# Patient Record
Sex: Female | Born: 1955 | Race: White | Hispanic: No | Smoking: Former smoker
Health system: Southern US, Community
[De-identification: ages and names within clinical notes are randomized; demographics above are authoritative.]

## PROBLEM LIST (undated history)

## (undated) DIAGNOSIS — K5790 Diverticulosis of intestine, part unspecified, without perforation or abscess without bleeding: Secondary | ICD-10-CM

## (undated) DIAGNOSIS — I10 Essential (primary) hypertension: Secondary | ICD-10-CM

## (undated) DIAGNOSIS — F32A Depression, unspecified: Secondary | ICD-10-CM

## (undated) DIAGNOSIS — K635 Polyp of colon: Secondary | ICD-10-CM

## (undated) DIAGNOSIS — F329 Major depressive disorder, single episode, unspecified: Secondary | ICD-10-CM

## (undated) DIAGNOSIS — D649 Anemia, unspecified: Secondary | ICD-10-CM

## (undated) DIAGNOSIS — F419 Anxiety disorder, unspecified: Secondary | ICD-10-CM

## (undated) DIAGNOSIS — E785 Hyperlipidemia, unspecified: Secondary | ICD-10-CM

## (undated) DIAGNOSIS — J189 Pneumonia, unspecified organism: Secondary | ICD-10-CM

## (undated) DIAGNOSIS — M1990 Unspecified osteoarthritis, unspecified site: Secondary | ICD-10-CM

## (undated) DIAGNOSIS — F410 Panic disorder [episodic paroxysmal anxiety] without agoraphobia: Secondary | ICD-10-CM

## (undated) DIAGNOSIS — K5792 Diverticulitis of intestine, part unspecified, without perforation or abscess without bleeding: Secondary | ICD-10-CM

## (undated) HISTORY — DX: Polyp of colon: K63.5

## (undated) HISTORY — PX: BREAST BIOPSY: SHX20

## (undated) HISTORY — DX: Essential (primary) hypertension: I10

## (undated) HISTORY — DX: Anxiety disorder, unspecified: F41.9

## (undated) HISTORY — PX: TOOTH EXTRACTION: SUR596

## (undated) HISTORY — DX: Major depressive disorder, single episode, unspecified: F32.9

## (undated) HISTORY — DX: Diverticulosis of intestine, part unspecified, without perforation or abscess without bleeding: K57.90

## (undated) HISTORY — DX: Diverticulitis of intestine, part unspecified, without perforation or abscess without bleeding: K57.92

## (undated) HISTORY — PX: APPENDECTOMY: SHX54

## (undated) HISTORY — DX: Hyperlipidemia, unspecified: E78.5

## (undated) HISTORY — DX: Depression, unspecified: F32.A

---
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
## 1996-03-04 HISTORY — PX: LUMBAR DISC SURGERY: SHX700

## 1997-06-28 ENCOUNTER — Other Ambulatory Visit: Admission: RE | Admit: 1997-06-28 | Discharge: 1997-06-28 | Payer: Self-pay | Admitting: Gynecology

## 1997-07-31 ENCOUNTER — Inpatient Hospital Stay (HOSPITAL_COMMUNITY): Admission: AD | Admit: 1997-07-31 | Discharge: 1997-08-02 | Payer: Self-pay | Admitting: Gynecology

## 1997-09-13 ENCOUNTER — Other Ambulatory Visit: Admission: RE | Admit: 1997-09-13 | Discharge: 1997-09-13 | Payer: Self-pay | Admitting: Obstetrics and Gynecology

## 1997-12-01 ENCOUNTER — Encounter (HOSPITAL_COMMUNITY): Admission: RE | Admit: 1997-12-01 | Discharge: 1998-03-01 | Payer: Self-pay | Admitting: *Deleted

## 1998-06-06 ENCOUNTER — Other Ambulatory Visit: Admission: RE | Admit: 1998-06-06 | Discharge: 1998-06-06 | Payer: Self-pay | Admitting: Surgery

## 1998-06-14 ENCOUNTER — Encounter: Payer: Self-pay | Admitting: Surgery

## 1998-06-14 ENCOUNTER — Ambulatory Visit (HOSPITAL_COMMUNITY): Admission: RE | Admit: 1998-06-14 | Discharge: 1998-06-14 | Payer: Self-pay | Admitting: Surgery

## 1998-09-06 ENCOUNTER — Other Ambulatory Visit: Admission: RE | Admit: 1998-09-06 | Discharge: 1998-09-06 | Payer: Self-pay | Admitting: Gynecology

## 1999-01-24 ENCOUNTER — Encounter: Admission: RE | Admit: 1999-01-24 | Discharge: 1999-01-24 | Payer: Self-pay | Admitting: Family Medicine

## 1999-11-07 ENCOUNTER — Encounter: Admission: RE | Admit: 1999-11-07 | Discharge: 1999-11-07 | Payer: Self-pay | Admitting: Family Medicine

## 1999-11-07 ENCOUNTER — Encounter: Payer: Self-pay | Admitting: Family Medicine

## 2000-12-02 ENCOUNTER — Encounter: Admission: RE | Admit: 2000-12-02 | Discharge: 2000-12-02 | Payer: Self-pay | Admitting: Family Medicine

## 2000-12-02 ENCOUNTER — Encounter: Payer: Self-pay | Admitting: Family Medicine

## 2001-10-21 ENCOUNTER — Other Ambulatory Visit: Admission: RE | Admit: 2001-10-21 | Discharge: 2001-10-21 | Payer: Self-pay | Admitting: Family Medicine

## 2002-01-04 ENCOUNTER — Encounter: Admission: RE | Admit: 2002-01-04 | Discharge: 2002-01-04 | Payer: Self-pay | Admitting: Family Medicine

## 2002-01-04 ENCOUNTER — Encounter: Payer: Self-pay | Admitting: Family Medicine

## 2002-01-08 ENCOUNTER — Ambulatory Visit (HOSPITAL_COMMUNITY): Admission: RE | Admit: 2002-01-08 | Discharge: 2002-01-08 | Payer: Self-pay | Admitting: *Deleted

## 2002-01-08 ENCOUNTER — Encounter (INDEPENDENT_AMBULATORY_CARE_PROVIDER_SITE_OTHER): Payer: Self-pay | Admitting: Specialist

## 2003-01-14 ENCOUNTER — Encounter: Admission: RE | Admit: 2003-01-14 | Discharge: 2003-01-14 | Payer: Self-pay | Admitting: Family Medicine

## 2004-02-03 ENCOUNTER — Ambulatory Visit (HOSPITAL_COMMUNITY): Admission: RE | Admit: 2004-02-03 | Discharge: 2004-02-03 | Payer: Self-pay | Admitting: Family Medicine

## 2005-02-18 ENCOUNTER — Ambulatory Visit (HOSPITAL_COMMUNITY): Admission: RE | Admit: 2005-02-18 | Discharge: 2005-02-18 | Payer: Self-pay | Admitting: Family Medicine

## 2005-03-04 HISTORY — PX: COLONOSCOPY: SHX174

## 2005-05-09 ENCOUNTER — Other Ambulatory Visit: Admission: RE | Admit: 2005-05-09 | Discharge: 2005-05-09 | Payer: Self-pay | Admitting: Family Medicine

## 2006-02-19 ENCOUNTER — Ambulatory Visit (HOSPITAL_COMMUNITY): Admission: RE | Admit: 2006-02-19 | Discharge: 2006-02-19 | Payer: Self-pay | Admitting: Family Medicine

## 2006-06-26 ENCOUNTER — Other Ambulatory Visit: Admission: RE | Admit: 2006-06-26 | Discharge: 2006-06-26 | Payer: Self-pay | Admitting: Family Medicine

## 2007-02-24 ENCOUNTER — Ambulatory Visit (HOSPITAL_COMMUNITY): Admission: RE | Admit: 2007-02-24 | Discharge: 2007-02-24 | Payer: Self-pay | Admitting: Family Medicine

## 2007-04-15 ENCOUNTER — Emergency Department (HOSPITAL_COMMUNITY): Admission: EM | Admit: 2007-04-15 | Discharge: 2007-04-15 | Payer: Self-pay | Admitting: Emergency Medicine

## 2008-03-16 ENCOUNTER — Ambulatory Visit (HOSPITAL_COMMUNITY): Admission: RE | Admit: 2008-03-16 | Discharge: 2008-03-16 | Payer: Self-pay | Admitting: Family Medicine

## 2008-03-22 ENCOUNTER — Encounter: Admission: RE | Admit: 2008-03-22 | Discharge: 2008-03-22 | Payer: Self-pay | Admitting: Family Medicine

## 2008-09-06 ENCOUNTER — Encounter: Admission: RE | Admit: 2008-09-06 | Discharge: 2008-09-06 | Payer: Self-pay | Admitting: Family Medicine

## 2009-03-17 ENCOUNTER — Encounter: Admission: RE | Admit: 2009-03-17 | Discharge: 2009-03-17 | Payer: Self-pay | Admitting: Family Medicine

## 2009-07-29 ENCOUNTER — Emergency Department (HOSPITAL_COMMUNITY): Admission: EM | Admit: 2009-07-29 | Discharge: 2009-07-30 | Payer: Self-pay | Admitting: Emergency Medicine

## 2009-09-21 ENCOUNTER — Encounter: Admission: RE | Admit: 2009-09-21 | Discharge: 2009-09-21 | Payer: Self-pay | Admitting: Family Medicine

## 2010-03-24 ENCOUNTER — Other Ambulatory Visit: Payer: Self-pay | Admitting: Family Medicine

## 2010-03-24 DIAGNOSIS — Z0000 Encounter for general adult medical examination without abnormal findings: Secondary | ICD-10-CM

## 2010-03-25 ENCOUNTER — Encounter: Payer: Self-pay | Admitting: Family Medicine

## 2010-04-04 ENCOUNTER — Ambulatory Visit
Admission: RE | Admit: 2010-04-04 | Discharge: 2010-04-04 | Disposition: A | Discharge status: Home / Self Care | Payer: BC Managed Care – PPO | Source: Ambulatory Visit | Attending: Family Medicine | Admitting: Family Medicine

## 2010-04-04 DIAGNOSIS — Z0000 Encounter for general adult medical examination without abnormal findings: Secondary | ICD-10-CM

## 2010-05-21 LAB — COMPREHENSIVE METABOLIC PANEL
Albumin: 4.2 g/dL (ref 3.5–5.2)
Alkaline Phosphatase: 65 U/L (ref 39–117)
CO2: 26 mEq/L (ref 19–32)
Chloride: 106 mEq/L (ref 96–112)
GFR calc Af Amer: >60 mL/min (ref >60)
GFR calc non Af Amer: >60 mL/min (ref >60)
Sodium: 140 mEq/L (ref 135–145)
Total Protein: 6.8 g/dL (ref 6.0–8.3)

## 2010-05-21 LAB — URINALYSIS, ROUTINE W REFLEX MICROSCOPIC
Bilirubin Urine: NEGATIVE
Ketones, ur: NEGATIVE mg/dL
Nitrite: NEGATIVE
Specific Gravity, Urine: 1.016 (ref 1.005–1.030)
pH: 5.5 (ref 5.0–8.0)

## 2010-05-21 LAB — DIFFERENTIAL
Basophils Absolute: 0 10*3/uL (ref 0.0–0.1)
Basophils Relative: 0 % (ref 0–1)
Eosinophils Absolute: 0.2 10*3/uL (ref 0.0–0.7)
Eosinophils Relative: 1 % (ref 0–5)
Lymphs Abs: 1.8 10*3/uL (ref 0.7–4.0)
Monocytes Absolute: 0.5 10*3/uL (ref 0.1–1.0)
Monocytes Relative: 3 % (ref 3–12)
Neutrophils Relative %: 84 % — ABNORMAL HIGH (ref 43–77)

## 2010-05-21 LAB — URINE MICROSCOPIC-ADD ON

## 2010-05-21 LAB — CBC
Hemoglobin: 13.4 g/dL (ref 12.0–15.0)
MCHC: 33.9 g/dL (ref 30.0–36.0)
Platelets: 328 10*3/uL (ref 150–400)
WBC: 15.1 10*3/uL — ABNORMAL HIGH (ref 4.0–10.5)

## 2010-05-21 LAB — LIPASE, BLOOD: Lipase: 29 U/L (ref 11–59)

## 2010-11-23 LAB — URINE MICROSCOPIC-ADD ON

## 2010-11-23 LAB — URINALYSIS, ROUTINE W REFLEX MICROSCOPIC
Glucose, UA: NEGATIVE
Ketones, ur: >80 — AB
Leukocytes, UA: NEGATIVE
Nitrite: NEGATIVE
Protein, ur: 30 — AB
Specific Gravity, Urine: 1.02
Urobilinogen, UA: 0.2
pH: 5.5

## 2010-11-23 LAB — COMPREHENSIVE METABOLIC PANEL WITH GFR
ALT: 14
AST: 14
Albumin: 3.7
Alkaline Phosphatase: 61
BUN: 8
CO2: 23
Calcium: 9.7
Chloride: 101
Creatinine, Ser: 0.9
GFR calc non Af Amer: >60
Glucose, Bld: 95
Potassium: 4
Sodium: 134 — ABNORMAL LOW
Total Bilirubin: 1.2
Total Protein: 7.1

## 2010-11-23 LAB — LIPASE, BLOOD: Lipase: 17

## 2010-11-23 LAB — POCT PREGNANCY, URINE
Operator id: 253041
Preg Test, Ur: NEGATIVE

## 2010-11-23 LAB — CBC
HCT: 35.7 — ABNORMAL LOW
MCHC: 34.5
RBC: 4.21
RDW: 15.3
WBC: 22.6 — ABNORMAL HIGH

## 2010-11-23 LAB — DIFFERENTIAL
Basophils Relative: 0
Eosinophils Absolute: 0
Lymphs Abs: 0.9
Monocytes Absolute: 0.9
Neutrophils Relative %: 92 — ABNORMAL HIGH

## 2011-06-07 ENCOUNTER — Other Ambulatory Visit: Payer: Self-pay | Admitting: Family Medicine

## 2011-06-07 DIAGNOSIS — Z1231 Encounter for screening mammogram for malignant neoplasm of breast: Secondary | ICD-10-CM

## 2011-06-21 ENCOUNTER — Ambulatory Visit
Admission: RE | Admit: 2011-06-21 | Discharge: 2011-06-21 | Disposition: A | Discharge status: Home / Self Care | Payer: BC Managed Care – PPO | Source: Ambulatory Visit | Attending: Family Medicine | Admitting: Family Medicine

## 2011-06-21 DIAGNOSIS — Z1231 Encounter for screening mammogram for malignant neoplasm of breast: Secondary | ICD-10-CM

## 2013-04-02 ENCOUNTER — Other Ambulatory Visit: Payer: Self-pay | Admitting: Obstetrics and Gynecology

## 2013-04-02 DIAGNOSIS — Z803 Family history of malignant neoplasm of breast: Secondary | ICD-10-CM

## 2013-04-02 DIAGNOSIS — R922 Inconclusive mammogram: Secondary | ICD-10-CM

## 2013-04-09 ENCOUNTER — Other Ambulatory Visit: Payer: BC Managed Care – PPO

## 2015-04-13 DIAGNOSIS — Z800 Family history of malignant neoplasm of digestive organs: Secondary | ICD-10-CM | POA: Insufficient documentation

## 2015-04-13 DIAGNOSIS — Z8719 Personal history of other diseases of the digestive system: Secondary | ICD-10-CM | POA: Insufficient documentation

## 2015-04-13 DIAGNOSIS — I10 Essential (primary) hypertension: Secondary | ICD-10-CM | POA: Insufficient documentation

## 2015-04-16 DIAGNOSIS — M19072 Primary osteoarthritis, left ankle and foot: Secondary | ICD-10-CM | POA: Insufficient documentation

## 2015-08-25 LAB — HM HEPATITIS C SCREENING LAB: HM Hepatitis Screen: NEGATIVE

## 2015-11-27 DIAGNOSIS — F5102 Adjustment insomnia: Secondary | ICD-10-CM | POA: Insufficient documentation

## 2015-11-27 DIAGNOSIS — F4320 Adjustment disorder, unspecified: Secondary | ICD-10-CM | POA: Diagnosis not present

## 2015-11-27 DIAGNOSIS — Z23 Encounter for immunization: Secondary | ICD-10-CM | POA: Diagnosis not present

## 2015-11-27 DIAGNOSIS — F430 Acute stress reaction: Secondary | ICD-10-CM

## 2015-11-27 DIAGNOSIS — I10 Essential (primary) hypertension: Secondary | ICD-10-CM | POA: Diagnosis not present

## 2015-11-27 DIAGNOSIS — F410 Panic disorder [episodic paroxysmal anxiety] without agoraphobia: Secondary | ICD-10-CM | POA: Diagnosis not present

## 2015-11-27 HISTORY — DX: Panic disorder (episodic paroxysmal anxiety): F41.0

## 2015-11-27 HISTORY — DX: Panic disorder (episodic paroxysmal anxiety): F43.0

## 2016-04-17 DIAGNOSIS — Z1231 Encounter for screening mammogram for malignant neoplasm of breast: Secondary | ICD-10-CM | POA: Diagnosis not present

## 2016-04-17 DIAGNOSIS — Z6830 Body mass index (BMI) 30.0-30.9, adult: Secondary | ICD-10-CM | POA: Diagnosis not present

## 2016-04-17 DIAGNOSIS — Z01419 Encounter for gynecological examination (general) (routine) without abnormal findings: Secondary | ICD-10-CM | POA: Diagnosis not present

## 2016-06-07 DIAGNOSIS — F5102 Adjustment insomnia: Secondary | ICD-10-CM | POA: Diagnosis not present

## 2016-06-07 DIAGNOSIS — Z1211 Encounter for screening for malignant neoplasm of colon: Secondary | ICD-10-CM | POA: Diagnosis not present

## 2016-06-07 DIAGNOSIS — Z0000 Encounter for general adult medical examination without abnormal findings: Secondary | ICD-10-CM | POA: Diagnosis not present

## 2016-06-07 DIAGNOSIS — I10 Essential (primary) hypertension: Secondary | ICD-10-CM | POA: Diagnosis not present

## 2016-07-19 DIAGNOSIS — H40052 Ocular hypertension, left eye: Secondary | ICD-10-CM | POA: Diagnosis not present

## 2016-07-19 DIAGNOSIS — H02834 Dermatochalasis of left upper eyelid: Secondary | ICD-10-CM | POA: Diagnosis not present

## 2016-07-19 DIAGNOSIS — H524 Presbyopia: Secondary | ICD-10-CM | POA: Diagnosis not present

## 2016-07-19 DIAGNOSIS — B0052 Herpesviral keratitis: Secondary | ICD-10-CM | POA: Diagnosis not present

## 2016-07-19 DIAGNOSIS — H02831 Dermatochalasis of right upper eyelid: Secondary | ICD-10-CM | POA: Diagnosis not present

## 2016-11-07 DIAGNOSIS — L821 Other seborrheic keratosis: Secondary | ICD-10-CM | POA: Diagnosis not present

## 2017-01-03 DIAGNOSIS — K2900 Acute gastritis without bleeding: Secondary | ICD-10-CM | POA: Diagnosis not present

## 2017-01-30 DIAGNOSIS — Z86010 Personal history of colonic polyps: Secondary | ICD-10-CM | POA: Diagnosis not present

## 2017-01-30 DIAGNOSIS — Z800 Family history of malignant neoplasm of digestive organs: Secondary | ICD-10-CM | POA: Diagnosis not present

## 2017-01-30 DIAGNOSIS — R1013 Epigastric pain: Secondary | ICD-10-CM | POA: Diagnosis not present

## 2017-02-04 ENCOUNTER — Ambulatory Visit: Payer: Self-pay | Admitting: Family Medicine

## 2017-02-12 ENCOUNTER — Other Ambulatory Visit: Payer: Self-pay | Admitting: *Deleted

## 2017-02-13 ENCOUNTER — Encounter: Payer: Self-pay | Admitting: *Deleted

## 2017-02-13 ENCOUNTER — Encounter: Payer: Self-pay | Admitting: Family Medicine

## 2017-02-14 ENCOUNTER — Encounter: Payer: Self-pay | Admitting: Family Medicine

## 2017-02-14 ENCOUNTER — Ambulatory Visit (INDEPENDENT_AMBULATORY_CARE_PROVIDER_SITE_OTHER): Payer: BLUE CROSS/BLUE SHIELD | Admitting: Family Medicine

## 2017-02-14 VITALS — BP 102/70 | HR 80 | Temp 97.8°F | Ht 64.0 in | Wt 171.0 lb

## 2017-02-14 DIAGNOSIS — T39395A Adverse effect of other nonsteroidal anti-inflammatory drugs [NSAID], initial encounter: Secondary | ICD-10-CM

## 2017-02-14 DIAGNOSIS — K2960 Other gastritis without bleeding: Secondary | ICD-10-CM

## 2017-02-14 DIAGNOSIS — Z800 Family history of malignant neoplasm of digestive organs: Secondary | ICD-10-CM | POA: Diagnosis not present

## 2017-02-14 DIAGNOSIS — E663 Overweight: Secondary | ICD-10-CM | POA: Diagnosis not present

## 2017-02-14 DIAGNOSIS — Z23 Encounter for immunization: Secondary | ICD-10-CM

## 2017-02-14 DIAGNOSIS — I10 Essential (primary) hypertension: Secondary | ICD-10-CM | POA: Diagnosis not present

## 2017-02-14 NOTE — Patient Instructions (Signed)
It was so good seeing you again! Thank you for establishing with my new practice and allowing me to continue caring for you. It means a lot to me.   Please schedule a follow up appointment with me in 5 months for your CPE, come fasting.

## 2017-02-14 NOTE — Progress Notes (Signed)
Subjective  CC:  Chief Complaint  Patient presents with  . Hypertension  . Depression  . Weight Loss    HPI: Ashley Duffy is a 61 y.o. female who presents to the office today to address the problems listed above in the chief complaint.  Hypertension f/u: Control is good . Pt reports she is doing well. taking medications as instructed, no medication side effects noted, home BP monitoring in range of 120-130s systolic over 70s diastolic, no TIAs, no chest pain on exertion, no dyspnea on exertion, no swelling of ankles. Working on weight loss. She denies adverse effects from his BP medications. Compliance with medication is good.   Weight loss f/u: eating clean x 2 months to work on weight loss; lowering carbohydrate intake. Fresh veggies and fruit. Needs to eat more protein. Has lost 6 pounds in 2 months.  Mood: doing well! No longer needing xanax. Feeling good about the holidays. Almost 3 years since death of husband. Had a spell of low mood about 2-3 months ago and was feeling draggy and achy. Used nsaids:   nsaid induced gastritis: upper abdominal pain; I reviewed GI notes. Now on PPI (omeprazole gave her a headache) and doing better. No longer achy. No blood loss. Has colonoscopy and EGD end of this month with [facility].   BP Readings from Last 3 Encounters:  02/14/17 102/70   Wt Readings from Last 3 Encounters:  02/14/17 171 lb (77.6 kg)    I reviewed the patients updated PMH, FH, and SocHx.    Patient Active Problem List   Diagnosis Date Noted  . Overweight (BMI 25.0-29.9) 02/14/2017  . Adjustment insomnia 11/27/2015  . Panic attack as reaction to stress 11/27/2015  . 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 Essential hypertension 04/13/2015  . Family history of colon cancer 04/13/2015  . History of diverticulitis 04/13/2015    Allergies: Penicillin g and Atorvastatin  Social History: Patient  reports that she has quit smoking. Her smoking use included  cigarettes. she has never used smokeless tobacco. She reports that she does not drink alcohol or use drugs.  No outpatient medications have been marked as taking for the 02/14/17 encounter (Office Visit) with Willow OraAndy, Naleigha Raimondi L, MD.    Review of Systems: Cardiovascular: negative for chest pain, palpitations, leg swelling, orthopnea, no lightheadedness Respiratory: negative for SOB, wheezing or persistent cough Gastrointestinal: negative for abdominal pain Genitourinary: negative for dysuria or gross hematuria  Objective  Vitals: BP 102/70 (BP Location: Left Arm, Patient Position: Sitting, Cuff Size: Normal)   Pulse 80   Temp 97.8 F (36.6 C) (Oral)   Ht 5\' 4"  (1.626 m)   Wt 171 lb (77.6 kg)   SpO2 97%   BMI 29.35 kg/m  General: no acute distress  Psych:  Alert and oriented, normal mood and affect HEENT:  Normocephalic, atraumatic, supple neck  Cardiovascular:  RRR without murmur. no edema Respiratory:  Good breath sounds bilaterally, CTAB with normal respiratory effort GI: soft nontender abdomen Skin:  Warm, no rashes Neurologic:   Mental status is normal  Assessment  1. Essential hypertension   2. Family history of colon cancer   3. NSAID induced gastritis   4. Overweight (BMI 25.0-29.9)   5. Need for prophylactic vaccination and inoculation against influenza      Plan    Hypertension f/u: BP control is well controlled. Monitor at home; call if having any sxs of low blood pressure. Continue amlodipine 10 daily.  Hyperlipidemia f/u: no statin indicated  Flu shot updated today. Colonoscopy scheduled this month for crc screen  GERD/nsaid induced gastritis: improving on Pepcid. Has EGD scheduled this month. Avoid nsaids.   Weight loss: discussed appropriate diet changes: to increase protein intake. Start exercising regularly again. Making good progress.  Education regarding management of these chronic disease states was given. Management strategies discussed on successive  visits include dietary and exercise recommendations, goals of achieving and maintaining IBW, and lifestyle modifications aiming for adequate sleep and minimizing stressors.   Follow up: 5 months for cpe with labs   Commons side effects, risks, benefits, and alternatives for medications and treatment plan prescribed today were discussed, and the patient expressed understanding of the given instructions. Patient is instructed to call or message via MyChart if he/she has any questions or concerns regarding our treatment plan. No barriers to understanding were identified. We discussed Red Flag symptoms and signs in detail. Patient expressed understanding regarding what to do in case of urgent or emergency type symptoms.   Medication list was reconciled, printed and provided to the patient in AVS. Patient instructions and summary information was reviewed with the patient as documented in the AVS. This note was prepared with assistance of Dragon voice recognition software. Occasional wrong-word or sound-a-like substitutions may have occurred due to the inherent limitations of voice recognition software  Orders Placed This Encounter  Procedures  . Flu Vaccine QUAD 36+ mos IM   No orders of the defined types were placed in this encounter.

## 2017-02-26 LAB — HM COLONOSCOPY

## 2017-02-27 DIAGNOSIS — K3189 Other diseases of stomach and duodenum: Secondary | ICD-10-CM | POA: Diagnosis not present

## 2017-02-27 DIAGNOSIS — K219 Gastro-esophageal reflux disease without esophagitis: Secondary | ICD-10-CM | POA: Diagnosis not present

## 2017-02-27 DIAGNOSIS — Z86010 Personal history of colonic polyps: Secondary | ICD-10-CM | POA: Diagnosis not present

## 2017-02-27 DIAGNOSIS — K5730 Diverticulosis of large intestine without perforation or abscess without bleeding: Secondary | ICD-10-CM | POA: Diagnosis not present

## 2017-04-04 LAB — HM MAMMOGRAPHY

## 2017-04-21 DIAGNOSIS — Z01419 Encounter for gynecological examination (general) (routine) without abnormal findings: Secondary | ICD-10-CM | POA: Diagnosis not present

## 2017-04-21 DIAGNOSIS — Z6827 Body mass index (BMI) 27.0-27.9, adult: Secondary | ICD-10-CM | POA: Diagnosis not present

## 2017-04-21 DIAGNOSIS — Z1231 Encounter for screening mammogram for malignant neoplasm of breast: Secondary | ICD-10-CM | POA: Diagnosis not present

## 2017-04-22 ENCOUNTER — Telehealth: Payer: Self-pay | Admitting: Family Medicine

## 2017-04-22 MED ORDER — AMLODIPINE BESYLATE 10 MG PO TABS: 10.0000 mg | ORAL_TABLET | Freq: Every day | ORAL | 0 refills | Status: DC

## 2017-04-22 NOTE — Telephone Encounter (Signed)
Copied from CRM [phone]. Topic: Quick Communication - Rx Refill/Question >> Apr 22, 2017 11:41 AM Gloriann LoanPayne, Nayelis Bonito L wrote: Medication: amLODipine (NORVASC) 10 MG tablet  Dr Mardelle MatteAndy did not prescribe this medicine to pt   Has the patient contacted their pharmacy? Yes.     (Agent: If no, request that the patient contact the pharmacy for the refill.)   Preferred Pharmacy (with phone number or street name): [id number] [place], [place] - [address] [phone] (Phone) [phone] (Fax)     Agent: Please be advised that RX refills may take up to 3 business days. We ask that you follow-up with your pharmacy.

## 2017-04-28 ENCOUNTER — Other Ambulatory Visit: Payer: Self-pay | Admitting: Obstetrics and Gynecology

## 2017-04-28 DIAGNOSIS — R928 Other abnormal and inconclusive findings on diagnostic imaging of breast: Secondary | ICD-10-CM

## 2017-04-30 DIAGNOSIS — Z86010 Personal history of colonic polyps: Secondary | ICD-10-CM | POA: Diagnosis not present

## 2017-04-30 DIAGNOSIS — K5730 Diverticulosis of large intestine without perforation or abscess without bleeding: Secondary | ICD-10-CM | POA: Diagnosis not present

## 2017-04-30 DIAGNOSIS — Z800 Family history of malignant neoplasm of digestive organs: Secondary | ICD-10-CM | POA: Diagnosis not present

## 2017-04-30 DIAGNOSIS — K219 Gastro-esophageal reflux disease without esophagitis: Secondary | ICD-10-CM | POA: Diagnosis not present

## 2017-05-02 ENCOUNTER — Other Ambulatory Visit: Payer: BLUE CROSS/BLUE SHIELD

## 2017-05-16 ENCOUNTER — Ambulatory Visit
Admission: RE | Admit: 2017-05-16 | Discharge: 2017-05-16 | Disposition: A | Discharge status: Home / Self Care | Payer: BLUE CROSS/BLUE SHIELD | Source: Ambulatory Visit | Attending: Obstetrics and Gynecology | Admitting: Obstetrics and Gynecology

## 2017-05-16 ENCOUNTER — Ambulatory Visit: Payer: BLUE CROSS/BLUE SHIELD

## 2017-05-16 DIAGNOSIS — R922 Inconclusive mammogram: Secondary | ICD-10-CM | POA: Diagnosis not present

## 2017-05-16 DIAGNOSIS — R928 Other abnormal and inconclusive findings on diagnostic imaging of breast: Secondary | ICD-10-CM

## 2017-05-18 DIAGNOSIS — N3001 Acute cystitis with hematuria: Secondary | ICD-10-CM | POA: Diagnosis not present

## 2017-05-21 ENCOUNTER — Other Ambulatory Visit: Payer: Self-pay | Admitting: Obstetrics and Gynecology

## 2017-05-21 DIAGNOSIS — Z803 Family history of malignant neoplasm of breast: Secondary | ICD-10-CM

## 2017-07-18 ENCOUNTER — Ambulatory Visit (INDEPENDENT_AMBULATORY_CARE_PROVIDER_SITE_OTHER): Payer: BLUE CROSS/BLUE SHIELD | Admitting: Family Medicine

## 2017-07-18 ENCOUNTER — Encounter: Payer: Self-pay | Admitting: Family Medicine

## 2017-07-18 ENCOUNTER — Other Ambulatory Visit: Payer: Self-pay

## 2017-07-18 VITALS — BP 120/78 | HR 82 | Temp 98.4°F | Ht 64.0 in | Wt 155.8 lb

## 2017-07-18 DIAGNOSIS — I10 Essential (primary) hypertension: Secondary | ICD-10-CM

## 2017-07-18 DIAGNOSIS — Z0000 Encounter for general adult medical examination without abnormal findings: Secondary | ICD-10-CM

## 2017-07-18 DIAGNOSIS — Z800 Family history of malignant neoplasm of digestive organs: Secondary | ICD-10-CM

## 2017-07-18 MED ORDER — AMLODIPINE BESYLATE 10 MG PO TABS: 10.0000 mg | ORAL_TABLET | Freq: Every day | ORAL | 3 refills | Status: DC

## 2017-07-18 NOTE — Progress Notes (Signed)
Subjective  Chief Complaint  Patient presents with  . Annual Exam    no complaints, doing well, HM up to date     HPI: Ashley Duffy is a 62 y.o. female who presents to [facility] today for a Female Wellness Visit.   Wellness Visit: annual visit with health maintenance review and exam without Pap   Doing great. Has upcoming trip to [place] next month to visit the family of her husband. Feels good about it. Son is traveling with her. No concerns. Healthy lifestyle. Has lost weight.   HTN is well controlled on meds. No AEs, sxs of CAD or CHF. Due for labs.   May be traveling to [place] for work; ? Needs imms/malarai prophylaxis.   Lifestyle: Body mass index is 26.74 kg/m. Wt Readings from Last 3 Encounters:  07/18/17 155 lb 12.8 oz (70.7 kg)  02/14/17 171 lb (77.6 kg)   Diet: low fat Exercise: frequently  Patient Active Problem List   Diagnosis Date Noted  . Overweight (BMI 25.0-29.9) 02/14/2017  . Adjustment insomnia 11/27/2015  . Panic attack as reaction to stress 11/27/2015  . 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 Essential hypertension 04/13/2015  . Family history of colon cancer 04/13/2015    Overview:  Father 11   . History of diverticulitis 04/13/2015   Health Maintenance  Topic Date Due  . HIV Screening  05/05/1970  . INFLUENZA VACCINE  10/02/2017  . MAMMOGRAM  04/04/2018  . PAP SMEAR  05/03/2018  . TETANUS/TDAP  05/25/2025  . COLONOSCOPY  02/27/2027  . Hepatitis C Screening  Completed   Immunization History  Administered Date(s) Administered  . Influenza, Seasonal, Injecte, Preservative Fre 01/12/2003, 11/27/2015  . Influenza,inj,Quad PF,6+ Mos 02/14/2017  . Pneumococcal Polysaccharide-23 08/06/2012  . Tdap 05/26/2015  . Zoster 05/26/2015   We updated and reviewed the patient's past history in detail and it is documented below. Allergies: Patient is allergic to penicillin g and  atorvastatin. Past Medical History Patient  has a past medical history of Anxiety, Colon polyp, Depression, Diverticulitis, Diverticulosis, Hyperlipidemia, and Hypertension. Past Surgical History Patient  has a past surgical history that includes Lumbar disc surgery (1998); Appendectomy; Colonoscopy (2007); and Breast biopsy (Left). Family History: Patient family history includes Alcohol abuse in her father; Alzheimer's disease in her mother; Arthritis in her brother; Breast cancer (age of onset: 35) in her mother; Breast cancer (age of onset: 71) in her maternal grandmother; Breast cancer (age of onset: 51) in her maternal aunt; Colon cancer in her father; Hypertension in her brother and mother; Thyroid disease in her mother. Social History:  Patient  reports that she has quit smoking. Her smoking use included cigarettes. She has never used smokeless tobacco. She reports that she does not drink alcohol or use drugs.  Review of Systems: Constitutional: negative for fever or malaise Ophthalmic: negative for photophobia, double vision or loss of vision Cardiovascular: negative for chest pain, dyspnea on exertion, or new LE swelling Respiratory: negative for SOB or persistent cough Gastrointestinal: negative for abdominal pain, change in bowel habits or melena Genitourinary: negative for dysuria or gross hematuria, no abnormal uterine bleeding or disharge Musculoskeletal: negative for new gait disturbance or muscular weakness Integumentary: negative for new or persistent rashes, no breast lumps Neurological: negative for TIA or stroke symptoms Psychiatric: negative for SI or delusions Allergic/Immunologic: negative for hives  Patient Care Team    Relationship Specialty Notifications Start End  Asencion Partridge  L, MD PCP - General Family Medicine  02/14/17   Ginette Otto, [facility] Phys[facility] Gynecology  02/14/17    Comment: Dr. Arelia Sneddon    Objective  Vitals: BP 120/78    Pulse 82   Temp 98.4 F (36.9 C)   Ht  (1.626 m)   Wt 155 lb 12.8 oz (70.7 kg)   BMI 26.74 kg/m  General:  Well developed, well nourished, no acute distress  Psych:  Alert and orientedx3,normal mood and affect HEENT:  Normocephalic, atraumatic, non-icteric sclera, PERRL, oropharynx is clear without mass or exudate, supple neck without adenopathy, mass or thyromegaly Cardiovascular:  Normal S1, S2, RRR without gallop, rub, + soft flow murmur, nondisplaced PMI Respiratory:  Good breath sounds bilaterally, CTAB with normal respiratory effort Gastrointestinal: normal bowel sounds, soft, non-tender, no noted masses. No HSM MSK: no deformities, contusions. Joints are without erythema or swelling. Spine and CVA region are nontender Skin:  Warm, no rashes or suspicious lesions noted Neurologic:    Mental status is normal. CN 2-11 are normal. Gross motor and sensory exams are normal. Normal gait. No tremor Assessment  1. Annual physical exam   2. Essential hypertension   3. Family history of colon cancer      Plan  Female Wellness Visit:  Age appropriate Health Maintenance and Prevention measures were discussed with patient. Included topics are cancer screening recommendations, ways to keep healthy (see AVS) including dietary and exercise recommendations, regular eye and dental care, use of seat belts, and avoidance of moderate alcohol use and tobacco use. Screens up to date.  BMI: discussed patient's BMI and encouraged positive lifestyle modifications to help get to or maintain a target BMI.  HM needs and immunizations were addressed and ordered. See below for orders. See HM and immunization section for updates.  Routine labs and screening tests ordered including cmp, cbc and lipids where appropriate.  Discussed recommendations regarding Vit D and calcium supplementation (see AVS)  HTN: This medical condition is well controlled. There are no signs of complications, medication side  effects, or red flags. Patient is instructed to continue the current treatment plan without change in therapies or medications.    Follow up: Return in about 6 months (around 01/18/2018) for follow up Hypertension.   Commons side effects, risks, benefits, and alternatives for medications and treatment plan prescribed today were discussed, and the patient expressed understanding of the given instructions. Patient is instructed to call or message via MyChart if he/she has any questions or concerns regarding our treatment plan. No barriers to understanding were identified. We discussed Red Flag symptoms and signs in detail. Patient expressed understanding regarding what to do in case of urgent or emergency type symptoms.   Medication list was reconciled, printed and provided to the patient in AVS. Patient instructions and summary information was reviewed with the patient as documented in the AVS. This note was prepared with assistance of Dragon voice recognition software. Occasional wrong-word or sound-a-like substitutions may have occurred due to the inherent limitations of voice recognition software  Orders Placed This Encounter  Procedures  . HM MAMMOGRAPHY  . CBC with Differential/Platelet  . Comprehensive metabolic panel  . Lipid panel  . HIV antibody  . HM COLONOSCOPY   Meds ordered this encounter  Medications  . amLODipine (NORVASC) 10 MG tablet    Sig: Take 1 tablet (10 mg total) by mouth daily.    Dispense:  90 tablet    Refill:  3

## 2017-07-18 NOTE — Patient Instructions (Signed)
Follow up in 6 Months to recheck blood pressure.   Please go to the Lab for blood work.    If you have MyChart, your results will be available to view, please respond through MyChart with questions.  We will schedule follow-up according to results.   Please do these things to maintain good health!   Exercise at least 30-45 minutes a day,  4-5 days a week.   Eat a low-fat diet with lots of fruits and vegetables, up to 7-9 servings per day.  Drink plenty of water daily. Try to drink 8 8oz glasses per day.  Seatbelts can save your life. Always wear your seatbelt.  Place Smoke Detectors on every level of your home and check batteries every year.  Schedule an appointment with an eye doctor for an eye exam every 1-2 years  Safe sex - use condoms to protect yourself from STDs if you could be exposed to these types of infections. Use birth control if you do not want to become pregnant and are sexually active.  Avoid heavy alcohol use. If you drink, keep it to less than 2 drinks/day and not every day.  Health Care Power of Attorney.  Choose someone you trust that could speak for you if you became unable to speak for yourself.  Depression is common in our stressful world.If you're feeling down or losing interest in things you normally enjoy, please come in for a visit.  If anyone is threatening or hurting you, please get help. Physical or Emotional Violence is never OK.

## 2017-07-19 LAB — HIV ANTIBODY (ROUTINE TESTING W REFLEX): HIV 1&2 Ab, 4th Generation: NONREACTIVE

## 2017-07-23 NOTE — Addendum Note (Signed)
Addended by: Marcille Blanco on: 07/23/2017 08:52 AM   Modules accepted: Orders

## 2017-07-29 ENCOUNTER — Other Ambulatory Visit (INDEPENDENT_AMBULATORY_CARE_PROVIDER_SITE_OTHER): Payer: BLUE CROSS/BLUE SHIELD

## 2017-07-29 DIAGNOSIS — Z0000 Encounter for general adult medical examination without abnormal findings: Secondary | ICD-10-CM | POA: Diagnosis not present

## 2017-07-29 LAB — CBC WITH DIFFERENTIAL/PLATELET
BASOS PCT: 0.4 % (ref 0.0–3.0)
Basophils Absolute: 0 10*3/uL (ref 0.0–0.1)
EOS PCT: 2.1 % (ref 0.0–5.0)
Eosinophils Absolute: 0.2 10*3/uL (ref 0.0–0.7)
HCT: 39.5 % (ref 36.0–46.0)
Hemoglobin: 13.8 g/dL (ref 12.0–15.0)
LYMPHS ABS: 2.3 10*3/uL (ref 0.7–4.0)
Lymphocytes Relative: 26.5 % (ref 12.0–46.0)
MCHC: 34.9 g/dL (ref 30.0–36.0)
MCV: 87.8 fl (ref 78.0–100.0)
MONOS PCT: 7.2 % (ref 3.0–12.0)
Monocytes Absolute: 0.6 10*3/uL (ref 0.1–1.0)
NEUTROS PCT: 63.8 % (ref 43.0–77.0)
Neutro Abs: 5.4 10*3/uL (ref 1.4–7.7)
Platelets: 451 10*3/uL — ABNORMAL HIGH (ref 150.0–400.0)
RBC: 4.5 Mil/uL (ref 3.87–5.11)
RDW: 14.7 % (ref 11.5–15.5)
WBC: 8.5 10*3/uL (ref 4.0–10.5)

## 2017-07-29 LAB — LIPID PANEL
CHOL/HDL RATIO: 3
Cholesterol: 247 mg/dL — ABNORMAL HIGH (ref 0–200)
HDL: 71 mg/dL (ref >39.00)
LDL CALC: 160 mg/dL — AB (ref 0–99)
NONHDL: 175.93
Triglycerides: 80 mg/dL (ref 0.0–149.0)
VLDL: 16 mg/dL (ref 0.0–40.0)

## 2017-07-29 LAB — COMPREHENSIVE METABOLIC PANEL
ALK PHOS: 126 U/L — AB (ref 39–117)
ALT: 44 U/L — ABNORMAL HIGH (ref 0–35)
AST: 23 U/L (ref 0–37)
Albumin: 4.3 g/dL (ref 3.5–5.2)
BUN: 12 mg/dL (ref 6–23)
CHLORIDE: 107 meq/L (ref 96–112)
CO2: 23 mEq/L (ref 19–32)
Calcium: 10.5 mg/dL (ref 8.4–10.5)
Creatinine, Ser: 0.7 mg/dL (ref 0.40–1.20)
GFR: 90.05 mL/min (ref >60.00)
GLUCOSE: 100 mg/dL — AB (ref 70–99)
Potassium: 4.4 mEq/L (ref 3.5–5.1)
SODIUM: 140 meq/L (ref 135–145)
Total Bilirubin: 0.4 mg/dL (ref 0.2–1.2)
Total Protein: 6.9 g/dL (ref 6.0–8.3)

## 2017-08-28 ENCOUNTER — Ambulatory Visit: Payer: BLUE CROSS/BLUE SHIELD | Admitting: Physician Assistant

## 2017-08-29 ENCOUNTER — Ambulatory Visit (INDEPENDENT_AMBULATORY_CARE_PROVIDER_SITE_OTHER): Payer: BLUE CROSS/BLUE SHIELD | Admitting: Physician Assistant

## 2017-08-29 ENCOUNTER — Encounter: Payer: Self-pay | Admitting: Physician Assistant

## 2017-08-29 ENCOUNTER — Other Ambulatory Visit: Payer: Self-pay

## 2017-08-29 VITALS — BP 104/78 | HR 66 | Temp 97.8°F | Resp 14 | Ht 64.0 in | Wt 155.0 lb

## 2017-08-29 DIAGNOSIS — H6982 Other specified disorders of Eustachian tube, left ear: Secondary | ICD-10-CM

## 2017-08-29 MED ORDER — FLUTICASONE PROPIONATE 50 MCG/ACT NA SUSP: 2.0000 | Freq: Every day | NASAL | 0 refills | Status: DC

## 2017-08-29 NOTE — Patient Instructions (Signed)
Please start a daily Claritin or Zyrtec. Use the Flonase daily as directed. Hold the nose and blow to help insufflate the ear drums. Do this once per day over the next 5 days or so.  If symptoms are not improving/resolving over the next week, please let me know as we will need to set you up with an ENT specialist.

## 2017-08-29 NOTE — Progress Notes (Signed)
Patient presents to clinic today c/o 5 weeks of L ear pressure, muffled hearing and tinnitus. Patent denies known trauma or injury. Denies fever, chills, fatigue. Denies nasal congestion, sinus pressure or pain. Denies sick contact. Recently traveled overseas via plane. Notes ear pressure and popping. .   Past Medical History:  Diagnosis Date  . Anxiety   . Colon polyp   . Depression   . Diverticulitis   . Diverticulosis   . Hyperlipidemia   . Hypertension     Current Outpatient Medications on File Prior to Visit  Medication Sig Dispense Refill  . ALPRAZolam (XANAX) 0.25 MG tablet Take 0.25 mg by mouth daily as needed.  5  . amLODipine (NORVASC) 10 MG tablet Take 1 tablet (10 mg total) by mouth daily. 90 tablet 3  . famotidine (PEPCID) 40 MG tablet Take 40 mg by mouth daily.      No current facility-administered medications on file prior to visit.     Allergies  Allergen Reactions  . Penicillin G Itching    Felt like blood was boiling Felt like blood was boiling   . Atorvastatin Other (See Comments)    Severe myalgias    Family History  Problem Relation Age of Onset  . Alzheimer's disease Mother   . Breast cancer Mother 57  . Hypertension Mother   . Thyroid disease Mother   . Alcohol abuse Father   . Colon cancer Father   . Arthritis Brother   . Hypertension Brother   . Breast cancer Maternal Aunt 54  . Breast cancer Maternal Grandmother 68  . Diabetes Neg Hx     Social History   Socioeconomic History  . Marital status: Married    Spouse name: Not on file  . Number of children: Not on file  . Years of education: Not on file  . Highest education level: Not on file  Occupational History  . Not on file  Social Needs  . Financial resource strain: Not on file  . Food insecurity:    Worry: Not on file    Inability: Not on file  . Transportation needs:    Medical: Not on file    Non-medical: Not on file  Tobacco Use  . Smoking status: Former Smoker   Types: Cigarettes  . Smokeless tobacco: Never Used  Substance and Sexual Activity  . Alcohol use: No    Frequency: Never    Comment: Pt quit last drink was 2 months ago  . Drug use: No  . Sexual activity: Never  Lifestyle  . Physical activity:    Days per week: Not on file    Minutes per session: Not on file  . Stress: Not on file  Relationships  . Social connections:    Talks on phone: Not on file    Gets together: Not on file    Attends religious service: Not on file    Active member of club or organization: Not on file    Attends meetings of clubs or organizations: Not on file    Relationship status: Not on file  Other Topics Concern  . Not on file  Social History Narrative  . Not on file   Review of Systems - See HPI.  All other ROS are negative.  BP 104/78   Pulse 66   Temp 97.8 F (36.6 C) (Oral)   Resp 14   Ht 5' 4"  (1.626 m)   Wt 155 lb (70.3 kg)   SpO2 98%  BMI 26.61 kg/m   Physical Exam  Constitutional: She appears well-developed and well-nourished.  HENT:  Head: Normocephalic and atraumatic.  Right Ear: External ear normal.  Left Ear: External ear normal. Tympanic membrane is retracted. A middle ear effusion (serous) is present.  Nose: Nose normal.  Mouth/Throat: Oropharynx is clear and moist.  Eyes: Pupils are equal, round, and reactive to light. EOM are normal.  Neck: Neck supple.  Cardiovascular: Normal rate, regular rhythm, normal heart sounds and intact distal pulses.  Pulmonary/Chest: Effort normal.  Skin: Skin is warm.  Vitals reviewed.  Recent Results (from the past 2160 hour(s))  HIV antibody     Status: None   Collection Time: 07/18/17  8:41 AM  Result Value Ref Range   HIV 1&2 Ab, 4th Generation NON-REACTIVE NON-REACTI    Comment: HIV-1 antigen and HIV-1/HIV-2 antibodies were not detected. There is no laboratory evidence of HIV infection. Marland Kitchen PLEASE NOTE: This information has been disclosed to you from records whose confidentiality  may be protected by state law.  If your state requires such protection, then the state law prohibits you from making any further disclosure of the information without the specific written consent of the person to whom it pertains, or as otherwise permitted by law. A general authorization for the release of medical or other information is NOT sufficient for this purpose. . For additional information please refer to http://education.questdiagnostics.com/faq/FAQ106 (This link is being provided for informational/ educational purposes only.) . Marland Kitchen The performance of this assay has not been clinically validated in patients less than 39 years old. .   Lipid Profile     Status: Abnormal   Collection Time: 07/29/17  8:58 AM  Result Value Ref Range   Cholesterol 247 (H) 0 - 200 mg/dL    Comment: ATP III Classification       Desirable:  < 200 mg/dL               Borderline High:  200 - 239 mg/dL          High:  > = 240 mg/dL   Triglycerides 80.0 0.0 - 149.0 mg/dL    Comment: Normal:  <150 mg/dLBorderline High:  150 - 199 mg/dL   HDL 71.00 >39.00 mg/dL   VLDL 16.0 0.0 - 40.0 mg/dL   LDL Cholesterol 160 (H) 0 - 99 mg/dL   Total CHOL/HDL Ratio 3     Comment:                Men          Women1/2 Average Risk     3.4          3.3Average Risk          5.0          4.42X Average Risk          9.6          7.13X Average Risk          15.0          11.0                       NonHDL 175.93     Comment: NOTE:  Non-HDL goal should be 30 mg/dL higher than patient's LDL goal (i.e. LDL goal of < 70 mg/dL, would have non-HDL goal of < 100 mg/dL)  Comp Met (CMET)     Status: Abnormal   Collection Time: 07/29/17  8:58 AM  Result Value  Ref Range   Sodium 140 135 - 145 mEq/L   Potassium 4.4 3.5 - 5.1 mEq/L   Chloride 107 96 - 112 mEq/L   CO2 23 19 - 32 mEq/L   Glucose, Bld 100 (H) 70 - 99 mg/dL   BUN 12 6 - 23 mg/dL   Creatinine, Ser 0.70 0.40 - 1.20 mg/dL   Total Bilirubin 0.4 0.2 - 1.2 mg/dL   Alkaline  Phosphatase 126 (H) 39 - 117 U/L   AST 23 0 - 37 U/L   ALT 44 (H) 0 - 35 U/L   Total Protein 6.9 6.0 - 8.3 g/dL   Albumin 4.3 3.5 - 5.2 g/dL   Calcium 10.5 8.4 - 10.5 mg/dL   GFR 90.05 >60.00 mL/min  CBC w/Diff     Status: Abnormal   Collection Time: 07/29/17  8:58 AM  Result Value Ref Range   WBC 8.5 4.0 - 10.5 K/uL   RBC 4.50 3.87 - 5.11 Mil/uL   Hemoglobin 13.8 12.0 - 15.0 g/dL   HCT 39.5 36.0 - 46.0 %   MCV 87.8 78.0 - 100.0 fl   MCHC 34.9 30.0 - 36.0 g/dL   RDW 14.7 11.5 - 15.5 %   Platelets 451.0 (H) 150.0 - 400.0 K/uL   Neutrophils Relative % 63.8 43.0 - 77.0 %   Lymphocytes Relative 26.5 12.0 - 46.0 %   Monocytes Relative 7.2 3.0 - 12.0 %   Eosinophils Relative 2.1 0.0 - 5.0 %   Basophils Relative 0.4 0.0 - 3.0 %   Neutro Abs 5.4 1.4 - 7.7 K/uL   Lymphs Abs 2.3 0.7 - 4.0 K/uL   Monocytes Absolute 0.6 0.1 - 1.0 K/uL   Eosinophils Absolute 0.2 0.0 - 0.7 K/uL   Basophils Absolute 0.0 0.0 - 0.1 K/uL    Assessment/Plan: 1. Eustachian tube dysfunction, left Start Flonase and OTC antihistamine. Insufflation technique discussed. If not improving, will consider trial or oral steroid versus ENT assessment.   - fluticasone (FLONASE) 50 MCG/ACT nasal spray; Place 2 sprays into both nostrils daily.  Dispense: 16 g; Refill: 0   Leeanne Rio, PA-C

## 2017-09-21 ENCOUNTER — Other Ambulatory Visit: Payer: Self-pay | Admitting: Physician Assistant

## 2017-09-21 DIAGNOSIS — H6982 Other specified disorders of Eustachian tube, left ear: Secondary | ICD-10-CM

## 2017-09-21 NOTE — Telephone Encounter (Signed)
Will defer further refills of patient's medications to PCP  

## 2018-01-23 ENCOUNTER — Encounter: Payer: Self-pay | Admitting: Family Medicine

## 2018-01-23 ENCOUNTER — Other Ambulatory Visit: Payer: Self-pay

## 2018-01-23 ENCOUNTER — Ambulatory Visit (INDEPENDENT_AMBULATORY_CARE_PROVIDER_SITE_OTHER): Payer: BLUE CROSS/BLUE SHIELD | Admitting: Family Medicine

## 2018-01-23 VITALS — BP 112/82 | HR 86 | Temp 98.0°F | Ht 64.0 in | Wt 155.4 lb

## 2018-01-23 DIAGNOSIS — I10 Essential (primary) hypertension: Secondary | ICD-10-CM

## 2018-01-23 DIAGNOSIS — Z23 Encounter for immunization: Secondary | ICD-10-CM

## 2018-01-23 DIAGNOSIS — F40243 Fear of flying: Secondary | ICD-10-CM | POA: Diagnosis not present

## 2018-01-23 MED ORDER — ZOSTER VAC RECOMB ADJUVANTED 50 MCG/0.5ML IM SUSR: 0.5000 mL | Freq: Once | INTRAMUSCULAR | 0 refills | Status: DC

## 2018-01-23 MED ORDER — AMLODIPINE BESYLATE 10 MG PO TABS: 10.0000 mg | ORAL_TABLET | Freq: Every day | ORAL | 3 refills | Status: DC

## 2018-01-23 MED ORDER — ALPRAZOLAM 0.25 MG PO TABS: 0.2500 mg | ORAL_TABLET | Freq: Every day | ORAL | 0 refills | Status: DC | PRN

## 2018-01-23 NOTE — Patient Instructions (Signed)
Please return in 6 months for your annual complete physical; please come fasting.  Glad you are doing so well! Happy Holidays.   If you have any questions or concerns, please don't hesitate to send me a message via MyChart or call the office at [phone]. Thank you for visiting with us today! It's our pleasure caring for you.

## 2018-01-23 NOTE — Progress Notes (Signed)
Subjective  CC:  Chief Complaint  Patient presents with  . Hypertension    doing well, doing well on medications, flu shot today     HPI: Ashley Duffy is a 62 y.o. female who presents to the office today to address the problems listed above in the chief complaint.  Hypertension f/u: Control is good . Pt reports she is doing well. taking medications as instructed, no medication side effects noted, no TIAs, no chest pain on exertion, no dyspnea on exertion, no swelling of ankles. She denies adverse effects from his BP medications. Compliance with medication is good.   Travels for work and pleasure. Would like xanax for flying. No longer with panic attacks or mood problems.    Assessment  1. Essential hypertension   2. Flying phobia   3. Need for immunization against influenza      Plan    Hypertension f/u: BP control is  controlled. This medical condition is well controlled. There are no signs of complications, medication side effects, or red flags. Patient is instructed to continue the current treatment plan without change in therapies or medications.   Xanax for flying  Flu and shingrix administered today  Education regarding management of these chronic disease states was given. Management strategies discussed on successive visits include dietary and exercise recommendations, goals of achieving and maintaining IBW, and lifestyle modifications aiming for adequate sleep and minimizing stressors.   Follow up: Return in about 6 months (around 07/24/2018) for complete physical, follow up Hypertension.  Orders Placed This Encounter  Procedures  . Flu Vaccine QUAD 36+ mos IM  . Varicella-zoster vaccine IM (Shingrix)   Meds ordered this encounter  Medications  . DISCONTD: Zoster Vaccine Adjuvanted [facility]) injection    Sig: Inject 0.5 mLs into the muscle once for 1 dose. Please give 2nd dose 2-6 months after first dose    Dispense:  2 each    Refill:  0  . ALPRAZolam  (XANAX) 0.25 MG tablet    Sig: Take 1 tablet (0.25 mg total) by mouth daily as needed.    Dispense:  20 tablet    Refill:  0  . amLODipine (NORVASC) 10 MG tablet    Sig: Take 1 tablet (10 mg total) by mouth daily.    Dispense:  90 tablet    Refill:  3      BP Readings from Last 3 Encounters:  01/23/18 112/82  08/29/17 104/78  07/18/17 120/78   Wt Readings from Last 3 Encounters:  01/23/18 155 lb 6.4 oz (70.5 kg)  08/29/17 155 lb (70.3 kg)  07/18/17 155 lb 12.8 oz (70.7 kg)    Lab Results  Component Value Date   CHOL 247 (H) 07/29/2017   Lab Results  Component Value Date   HDL 71.00 07/29/2017   Lab Results  Component Value Date   LDLCALC 160 (H) 07/29/2017   Lab Results  Component Value Date   TRIG 80.0 07/29/2017   Lab Results  Component Value Date   CHOLHDL 3 07/29/2017   No results found for: LDLDIRECT Lab Results  Component Value Date   CREATININE 0.70 07/29/2017   BUN 12 07/29/2017   NA 140 07/29/2017   K 4.4 07/29/2017   CL 107 07/29/2017   CO2 23 07/29/2017    The 10-year ASCVD risk score Denman George DC Jr., et al., 2013) is: 4.2%   Values used to calculate the score:     Age: 62 years     Sex:  Female     Is Non-Hispanic African American: No     Diabetic: No     Tobacco smoker: No     Systolic Blood Pressure: 112 mmHg     Is BP treated: Yes     HDL Cholesterol: 71 mg/dL     Total Cholesterol: 247 mg/dL  I reviewed the patients updated PMH, FH, and SocHx.    Patient Active Problem List   Diagnosis Date Noted  . Overweight (BMI 25.0-29.9) 02/14/2017  . Adjustment insomnia 11/27/2015  . Panic attack as reaction to stress 11/27/2015  . 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 Essential hypertension 04/13/2015  . Family history of colon cancer 04/13/2015  . History of diverticulitis 04/13/2015    Allergies: Penicillin g and Atorvastatin  Social History: Patient  reports that she has quit smoking. Her smoking use included  cigarettes. She has never used smokeless tobacco. She reports that she does not drink alcohol or use drugs.  Current Meds  Medication Sig  . amLODipine (NORVASC) 10 MG tablet Take 1 tablet (10 mg total) by mouth daily.  . [DISCONTINUED] amLODipine (NORVASC) 10 MG tablet Take 1 tablet (10 mg total) by mouth daily.  . [DISCONTINUED] fluticasone (FLONASE) 50 MCG/ACT nasal spray SPRAY 2 SPRAYS INTO EACH NOSTRIL EVERY DAY    Review of Systems: Cardiovascular: negative for chest pain, palpitations, leg swelling, orthopnea Respiratory: negative for SOB, wheezing or persistent cough Gastrointestinal: negative for abdominal pain Genitourinary: negative for dysuria or gross hematuria  Objective  Vitals: BP 112/82   Pulse 86   Temp 98 F (36.7 C)   Ht 5\' 4"  (1.626 m)   Wt 155 lb 6.4 oz (70.5 kg)   SpO2 99%   BMI 26.67 kg/m  General: no acute distress  Psych:  Alert and oriented, normal mood and affect HEENT:  Normocephalic, atraumatic, supple neck  Cardiovascular:  RRR without murmur. no edema Respiratory:  Good breath sounds bilaterally, CTAB with normal respiratory effort Skin:  Warm, no rashes Neurologic:   Mental status is normal  Commons side effects, risks, benefits, and alternatives for medications and treatment plan prescribed today were discussed, and the patient expressed understanding of the given instructions. Patient is instructed to call or message via MyChart if he/she has any questions or concerns regarding our treatment plan. No barriers to understanding were identified. We discussed Red Flag symptoms and signs in detail. Patient expressed understanding regarding what to do in case of urgent or emergency type symptoms.   Medication list was reconciled, printed and provided to the patient in AVS. Patient instructions and summary information was reviewed with the patient as documented in the AVS. This note was prepared with assistance of Dragon voice recognition software.  Occasional wrong-word or sound-a-like substitutions may have occurred due to the inherent limitations of voice recognition software

## 2018-02-23 ENCOUNTER — Telehealth: Payer: Self-pay | Admitting: Family Medicine

## 2018-02-23 MED ORDER — ALPRAZOLAM 1 MG PO TABS: 0.5000 mg | ORAL_TABLET | Freq: Every day | ORAL | 0 refills | Status: DC | PRN

## 2018-02-23 NOTE — Telephone Encounter (Signed)
Due to xanax 0.25 mg not being available, I changed to xanx 1mg  tabs; 0.5 daily prn #20.

## 2018-02-23 NOTE — Telephone Encounter (Signed)
Please advise 

## 2018-02-23 NOTE — Telephone Encounter (Signed)
Copied from CRM [id number]. Topic: Quick Communication - Rx Refill/Question >> Feb 23, 2018  8:29 AM Maia Pettiesrtiz, Kristie S wrote: Medication: ALPRAZolam Prudy Feeler(XANAX) 0.25 MG tablet - pt called stating she has not been able to fill medication as it is on backorder  - she has tried to wait until it is back in stock. The pharmacy advised only the 1mg  is available. Pt said she can cut it but she needs something. Please advise.   Has the patient contacted their pharmacy? Yes - out of stock Preferred Pharmacy (with phone number or street name): [id number] [place], [place] - [address] [phone] (Phone) [phone] (Fax)

## 2018-02-23 NOTE — Telephone Encounter (Signed)
Pt is aware new RX has been sent to pharmacy.

## 2018-07-24 ENCOUNTER — Encounter: Payer: Self-pay | Admitting: Family Medicine

## 2018-07-24 ENCOUNTER — Ambulatory Visit (INDEPENDENT_AMBULATORY_CARE_PROVIDER_SITE_OTHER): Payer: BLUE CROSS/BLUE SHIELD | Admitting: Family Medicine

## 2018-07-24 ENCOUNTER — Other Ambulatory Visit: Payer: Self-pay

## 2018-07-24 VITALS — BP 128/70 | HR 75 | Temp 98.4°F | Resp 16 | Ht 64.0 in | Wt 167.2 lb

## 2018-07-24 DIAGNOSIS — Z0000 Encounter for general adult medical examination without abnormal findings: Secondary | ICD-10-CM

## 2018-07-24 DIAGNOSIS — F40243 Fear of flying: Secondary | ICD-10-CM

## 2018-07-24 DIAGNOSIS — I10 Essential (primary) hypertension: Secondary | ICD-10-CM | POA: Diagnosis not present

## 2018-07-24 LAB — COMPREHENSIVE METABOLIC PANEL
ALT: 39 U/L — ABNORMAL HIGH (ref 0–35)
AST: 26 U/L (ref 0–37)
Albumin: 4.5 g/dL (ref 3.5–5.2)
Alkaline Phosphatase: 76 U/L (ref 39–117)
BUN: 12 mg/dL (ref 6–23)
CO2: 25 mEq/L (ref 19–32)
Calcium: 10.3 mg/dL (ref 8.4–10.5)
Chloride: 107 mEq/L (ref 96–112)
Creatinine, Ser: 0.84 mg/dL (ref 0.40–1.20)
GFR: 68.43 mL/min (ref >60.00)
Glucose, Bld: 94 mg/dL (ref 70–99)
Potassium: 3.9 mEq/L (ref 3.5–5.1)
Sodium: 141 mEq/L (ref 135–145)
Total Bilirubin: 0.4 mg/dL (ref 0.2–1.2)
Total Protein: 7 g/dL (ref 6.0–8.3)

## 2018-07-24 LAB — CBC WITH DIFFERENTIAL/PLATELET
Basophils Absolute: 0 10*3/uL (ref 0.0–0.1)
Basophils Relative: 0.6 % (ref 0.0–3.0)
Eosinophils Absolute: 0.2 10*3/uL (ref 0.0–0.7)
Eosinophils Relative: 2.8 % (ref 0.0–5.0)
HCT: 41.8 % (ref 36.0–46.0)
Hemoglobin: 14.3 g/dL (ref 12.0–15.0)
Lymphocytes Relative: 28.2 % (ref 12.0–46.0)
Lymphs Abs: 1.9 10*3/uL (ref 0.7–4.0)
MCHC: 34.2 g/dL (ref 30.0–36.0)
MCV: 89.6 fl (ref 78.0–100.0)
Monocytes Absolute: 0.6 10*3/uL (ref 0.1–1.0)
Monocytes Relative: 8.4 % (ref 3.0–12.0)
Neutro Abs: 4.1 10*3/uL (ref 1.4–7.7)
Neutrophils Relative %: 60 % (ref 43.0–77.0)
Platelets: 388 10*3/uL (ref 150.0–400.0)
RBC: 4.66 Mil/uL (ref 3.87–5.11)
RDW: 14.4 % (ref 11.5–15.5)
WBC: 6.8 10*3/uL (ref 4.0–10.5)

## 2018-07-24 LAB — LIPID PANEL
Cholesterol: 266 mg/dL — ABNORMAL HIGH (ref 0–200)
HDL: 79.5 mg/dL (ref >39.00)
LDL Cholesterol: 161 mg/dL — ABNORMAL HIGH (ref 0–99)
NonHDL: 186.28
Total CHOL/HDL Ratio: 3
Triglycerides: 127 mg/dL (ref 0.0–149.0)
VLDL: 25.4 mg/dL (ref 0.0–40.0)

## 2018-07-24 MED ORDER — ALPRAZOLAM 0.25 MG PO TABS: 0.2500 mg | ORAL_TABLET | Freq: Every day | ORAL | 0 refills | Status: DC | PRN

## 2018-07-24 MED ORDER — AMLODIPINE BESYLATE 10 MG PO TABS: 10.0000 mg | ORAL_TABLET | Freq: Every day | ORAL | 3 refills | Status: DC

## 2018-07-24 NOTE — Progress Notes (Signed)
Subjective  Chief Complaint  Patient presents with  . Annual Exam    Fasting  . Nevus    Under left eye comes and goes, itches at times.. She does not use anything on the area  . Hypertension  . Wants to discuss antibody testing    HPI: Ashley Duffy is a 63 y.o. female who presents to [facility] [place] today for a Female Wellness Visit. She also has the concerns and/or needs as listed above in the chief complaint. These will be addressed in addition to the Health Maintenance Visit.   Wellness Visit: annual visit with health maintenance review and exam without Pap   HM: has gyn appt scheduled for august: pap, mammo and dexa with Dr. Arelia Sneddon. She is overall doing well. Managing stress of losing job and having son home from college due to pandemic well. Denies panic attacks or sxs of depression. Will use rare xanax for anxiety or flying phobia Chronic disease f/u and/or acute problem visit: (deemed necessary to be done in addition to the wellness visit):  HTN f/u: Feeling well. Taking medications w/o adverse effects. No symptoms of CHF, angina; no palpitations, sob, cp or lower extremity edema. Compliant with meds.   Has spot beneath left eye: flesh colored but will get irritated at times. Nonpainful. No itching. Wants to be sure it is not precancerous.   Had URI sxs earlier this year with loss of taste and smell. 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 ? Antibody testing.   Assessment  1. Annual physical exam   2. Essential hypertension   3. Flying phobia      Plan  Female Wellness Visit:  Age appropriate Health Maintenance and Prevention measures were discussed with patient. Included topics are cancer screening recommendations, ways to keep healthy (see AVS) including dietary and exercise recommendations, regular eye and dental care, use of seat belts, and avoidance of moderate alcohol use and tobacco use. Female wellness upcoming to get pap and mammo  and dex.   BMI: discussed patient's BMI and encouraged positive lifestyle modifications to help get to or maintain a target BMI.  HM needs and immunizations were addressed and ordered. See below for orders. See HM and immunization section for updates.  Routine labs and screening tests ordered including cmp, cbc and lipids where appropriate.  Discussed recommendations regarding Vit D and calcium supplementation (see AVS)  Chronic disease management visit and/or acute problem visit:  HTN: well controlled. Continue meds. Check labs.  Refilled low dose xanax. Rare use. Monitor mood during pandemic crisis.   Antibody testing: not indicated at this time. Pt defers due to high false negative rate and unclear immune response with covid-19  Skin mole: benign. Reassure.  Follow up: Return in about 6 months (around 01/24/2019) for follow up Hypertension.  Orders Placed This Encounter  Procedures  . CBC with Differential/Platelet  . Comprehensive metabolic panel  . Lipid panel   Meds ordered this encounter  Medications  . amLODipine (NORVASC) 10 MG tablet    Sig: Take 1 tablet (10 mg total) by mouth daily.    Dispense:  90 tablet    Refill:  3  . ALPRAZolam (XANAX) 0.25 MG tablet    Sig: Take 1 tablet (0.25 mg total) by mouth daily as needed for anxiety.    Dispense:  20 tablet    Refill:  0      Lifestyle: Body mass index is 28.7 kg/m. Wt Readings from Last 3  Encounters:  07/24/18 167 lb 3.2 oz (75.8 kg)  01/23/18 155 lb 6.4 oz (70.5 kg)  08/29/17 155 lb (70.3 kg)   Diet: general Exercise: intermittently,   Patient Active Problem List   Diagnosis Date Noted  . Overweight (BMI 25.0-29.9) 02/14/2017  . Adjustment insomnia 11/27/2015  . 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 Essential hypertension 04/13/2015  . Family history of colon cancer 04/13/2015    Overview:  Father [id number]   . History of diverticulitis 04/13/2015   Health Maintenance  Topic Date  Due  . MAMMOGRAM  04/04/2018  . PAP SMEAR-Modifier  05/03/2018  . INFLUENZA VACCINE  10/03/2018  . TETANUS/TDAP  05/25/2025  . COLONOSCOPY  02/27/2027  . Hepatitis C Screening  Completed  . HIV Screening  Completed   Immunization History  Administered Date(s) Administered  . Influenza, Seasonal, Injecte, Preservative Fre 01/12/2003, 11/27/2015  . Influenza,inj,Quad PF,6+ Mos 02/14/2017, 01/23/2018  . Pneumococcal Polysaccharide-23 08/06/2012  . Tdap 05/26/2015  . Zoster 05/26/2015  . Zoster Recombinat (Shingrix) 01/23/2018   We updated and reviewed the patient's past history in detail and it is documented below. Allergies: Patient is allergic to penicillin g and atorvastatin. Past Medical History Patient  has a past medical history of Anxiety, Colon polyp, Depression, Diverticulitis, Diverticulosis, Hyperlipidemia, Hypertension, and Panic attack as reaction to stress (11/27/2015). Past Surgical History Patient  has a past surgical history that includes Lumbar disc surgery (1998); Appendectomy; Colonoscopy (2007); and Breast biopsy (Left). Family History: Patient family history includes Alcohol abuse in her father; Alzheimer's disease in her mother; Arthritis in her brother; Breast cancer (age of onset: 7135) in her mother; Breast cancer (age of onset: 2750) in her maternal grandmother; Breast cancer (age of onset: 7155) in her maternal aunt; Colon cancer in her father; Hypertension in her brother and mother; Thyroid disease in her mother. Social History:  Patient  reports that she has quit smoking. Her smoking use included cigarettes. She has never used smokeless tobacco. She reports that she does not drink alcohol or use drugs.  Review of Systems: Constitutional: negative for fever or malaise Ophthalmic: negative for photophobia, double vision or loss of vision Cardiovascular: negative for chest pain, dyspnea on exertion, or new LE swelling Respiratory: negative for SOB or persistent  cough Gastrointestinal: negative for abdominal pain, change in bowel habits or melena Genitourinary: negative for dysuria or gross hematuria, no abnormal uterine bleeding or disharge Musculoskeletal: negative for new gait disturbance or muscular weakness Integumentary: negative for new or persistent rashes, no breast lumps Neurological: negative for TIA or stroke symptoms Psychiatric: negative for SI or delusions Allergic/Immunologic: negative for hives  Patient Care Team    Relationship Specialty Notifications Start End  Willow OraAndy, Naasir Carreira L, MD PCP - General Family Medicine  02/14/17   [place], [facility] Physician Gynecology  02/14/17    Comment: Dr. Arelia SneddonMcComb    Objective  Vitals: BP 128/70   Pulse 75   Temp 98.4 F (36.9 C) (Oral)   Resp 16   Ht 5\' 4"  (1.626 m)   Wt 167 lb 3.2 oz (75.8 kg)   SpO2 98%   BMI 28.70 kg/m  General:  Well developed, well nourished, no acute distress  Psych:  Alert and orientedx3,normal mood and affect HEENT:  Normocephalic, atraumatic, non-icteric sclera, PERRL, oropharynx is clear without mass or exudate, supple neck without adenopathy, mass or thyromegaly Cardiovascular:  Normal S1, S2, RRR without gallop, rub or murmur, nondisplaced PMI Respiratory:  Good breath sounds bilaterally, CTAB with normal respiratory effort Gastrointestinal: normal bowel sounds, soft, non-tender, no noted masses. No HSM MSK: no deformities, contusions. Joints are without erythema or swelling. Spine and CVA region are nontender Skin:  Warm, no rashes or suspicious lesions noted, small benign appearing flesh colored nevus beneath left eye. Neurologic:    Mental status is normal. CN 2-11 are normal. Gross motor and sensory exams are normal. Normal gait. No tremor   Commons side effects, risks, benefits, and alternatives for medications and treatment plan prescribed today were discussed, and the patient expressed understanding of the given  instructions. Patient is instructed to call or message via MyChart if he/she has any questions or concerns regarding our treatment plan. No barriers to understanding were identified. We discussed Red Flag symptoms and signs in detail. Patient expressed understanding regarding what to do in case of urgent or emergency type symptoms.   Medication list was reconciled, printed and provided to the patient in AVS. Patient instructions and summary information was reviewed with the patient as documented in the AVS. This note was prepared with assistance of Dragon voice recognition software. Occasional wrong-word or sound-a-like substitutions may have occurred due to the inherent limitations of voice recognition software

## 2018-07-24 NOTE — Patient Instructions (Addendum)
Please return in 6 mo for follow up of your hypertension.  I will release your lab results to you on your MyChart account with further instructions. Please reply with any questions.  Please have Dr.Mccomb send me his results from your mammogram, pap smear and bone density.   If you have any questions or concerns, please don't hesitate to send me a message via MyChart or call the office at [phone]. Thank you for visiting with Ashley Duffy today! It's our pleasure caring for you.  Please do these things to maintain good health!   Exercise at least 30-45 minutes a day,  4-5 days a week.   Eat a low-fat diet with lots of fruits and vegetables, up to 7-9 servings per day.  Drink plenty of water daily. Try to drink 8 8oz glasses per day.  Seatbelts can save your life. Always wear your seatbelt.  Place Smoke Detectors on every level of your home and check batteries every year.  Schedule an appointment with an eye doctor for an eye exam every 1-2 years  Safe sex - use condoms to protect yourself from STDs if you could be exposed to these types of infections. Use birth control if you do not want to become pregnant and are sexually active.  Avoid heavy alcohol use. If you drink, keep it to less than 2 drinks/day and not every day.  Health Care Power of Attorney.  Choose someone you trust that could speak for you if you became unable to speak for yourself.  Depression is common in our stressful world.If you're feeling down or losing interest in things you normally enjoy, please come in for a visit.  If anyone is threatening or hurting you, please get help. Physical or Emotional Violence is never OK.    Calcium Intake Recommendations You can take Caltrate Plus twice a day or get it through your diet or other OTC supplements (Viactiv, OsCal etc)  Calcium is a mineral that affects many functions in the body, including:  Blood clotting.  Blood vessel function.  Nerve impulse  conduction.  Hormone secretion.  Muscle contraction.  Bone and teeth functions.  Most of your body's calcium supply is stored in your bones and teeth. When your calcium stores are low, you may be at risk for low bone mass, bone loss, and bone fractures. Consuming enough calcium helps to grow healthy bones and teeth and to prevent breakdown over time. It is very important that you get enough calcium if you are:  A child undergoing rapid growth.  An adolescent girl.  A pre- or post-menopausal woman.  A woman whose menstrual cycle has stopped due to anorexia nervosa or regular intense exercise.  An individual with lactose intolerance or a milk allergy.  A vegetarian.  What is my plan? Try to consume the recommended amount of calcium daily based on your age. Depending on your overall health, your health care provider may recommend increased calcium intake.General daily calcium intake recommendations by age are:  Birth to 6 months: 200 mg.  Infants 7 to 12 months: 260 mg.  Children 1 to 3 years: 700 mg.  Children 4 to 8 years: 1,000 mg.  Children 9 to 13 years: 1,300 mg.  Teens 14 to 18 years: 1,300 mg.  Adults 19 to 50 years: 1,000 mg.  Adult women 51 to 70 years: 1,200 mg.  Adult men 51 to 70 years: 1,000 mg.  Adults 71 years and older: 1,200 mg.  Pregnant and breastfeeding teens: 1,300 mg.  Pregnant and breastfeeding adults: 1,000 mg.  What do I need to know about calcium intake?  In order for the body to absorb calcium, it needs vitamin D. You can get vitamin D through (we recommend getting [phone] units of Vitamin D daily) ? Direct exposure of the skin to sunlight. ? Foods, such as egg yolks, liver, saltwater fish, and fortified milk. ? Supplements.  Consuming too much calcium may cause: ? Constipation. ? Decreased absorption of iron and zinc. ? Kidney stones.  Calcium supplements may interact with certain medicines. Check with your health care provider  before starting any calcium supplements.  Try to get most of your calcium from food. What foods can I eat? Grains  Fortified oatmeal. Fortified ready-to-eat cereals. Fortified frozen waffles. Vegetables Turnip greens. Broccoli. Fruits Fortified orange juice. Meats and Other Protein Sources Canned sardines with bones. Canned salmon with bones. Soy beans. Tofu. Baked beans. Almonds. [place] nuts. Sunflower seeds. Dairy Milk. Yogurt. Cheese. Cottage cheese. Beverages Fortified soy milk. Fortified rice milk. Sweets/Desserts Pudding. Ice Cream. Milkshakes. Blackstrap molasses. The items listed above may not be a complete list of recommended foods or beverages. Contact your dietitian for more options. What foods can affect my calcium intake? It may be more difficult for your body to use calcium or calcium may leave your body more quickly if you consume large amounts of:  Sodium.  Protein.  Caffeine.  Alcohol.  This information is not intended to replace advice given to you by your health care provider. Make sure you discuss any questions you have with your health care provider. Document Released: 10/03/2003 Document Revised: 09/08/2015 Document Reviewed: 07/27/2013 Elsevier Interactive Patient Education  2018 [employer]

## 2018-10-05 DIAGNOSIS — M8580 Other specified disorders of bone density and structure, unspecified site: Secondary | ICD-10-CM | POA: Insufficient documentation

## 2018-10-05 DIAGNOSIS — Z01419 Encounter for gynecological examination (general) (routine) without abnormal findings: Secondary | ICD-10-CM | POA: Diagnosis not present

## 2018-10-05 DIAGNOSIS — Z13820 Encounter for screening for osteoporosis: Secondary | ICD-10-CM | POA: Diagnosis not present

## 2018-10-05 DIAGNOSIS — Z6828 Body mass index (BMI) 28.0-28.9, adult: Secondary | ICD-10-CM | POA: Diagnosis not present

## 2018-10-05 DIAGNOSIS — Z1231 Encounter for screening mammogram for malignant neoplasm of breast: Secondary | ICD-10-CM | POA: Diagnosis not present

## 2018-10-06 ENCOUNTER — Other Ambulatory Visit: Payer: Self-pay | Admitting: Obstetrics and Gynecology

## 2018-10-06 DIAGNOSIS — R928 Other abnormal and inconclusive findings on diagnostic imaging of breast: Secondary | ICD-10-CM

## 2018-10-08 ENCOUNTER — Ambulatory Visit: Payer: BLUE CROSS/BLUE SHIELD

## 2018-10-08 ENCOUNTER — Other Ambulatory Visit: Payer: Self-pay

## 2018-10-08 ENCOUNTER — Ambulatory Visit
Admission: RE | Admit: 2018-10-08 | Discharge: 2018-10-08 | Disposition: A | Discharge status: Home / Self Care | Payer: BLUE CROSS/BLUE SHIELD | Source: Ambulatory Visit | Attending: Obstetrics and Gynecology | Admitting: Obstetrics and Gynecology

## 2018-10-08 DIAGNOSIS — R928 Other abnormal and inconclusive findings on diagnostic imaging of breast: Secondary | ICD-10-CM | POA: Diagnosis not present

## 2019-01-29 ENCOUNTER — Ambulatory Visit: Payer: BLUE CROSS/BLUE SHIELD | Admitting: Family Medicine

## 2019-02-05 ENCOUNTER — Ambulatory Visit: Payer: BLUE CROSS/BLUE SHIELD | Admitting: Family Medicine

## 2019-04-02 ENCOUNTER — Other Ambulatory Visit: Payer: Self-pay

## 2019-04-05 ENCOUNTER — Ambulatory Visit (INDEPENDENT_AMBULATORY_CARE_PROVIDER_SITE_OTHER): Payer: BC Managed Care – PPO | Admitting: Family Medicine

## 2019-04-05 ENCOUNTER — Encounter: Payer: Self-pay | Admitting: Family Medicine

## 2019-04-05 ENCOUNTER — Other Ambulatory Visit: Payer: Self-pay

## 2019-04-05 VITALS — BP 118/88 | HR 78 | Temp 97.3°F | Ht 64.0 in | Wt 167.6 lb

## 2019-04-05 DIAGNOSIS — F430 Acute stress reaction: Secondary | ICD-10-CM | POA: Diagnosis not present

## 2019-04-05 DIAGNOSIS — M21612 Bunion of left foot: Secondary | ICD-10-CM | POA: Diagnosis not present

## 2019-04-05 DIAGNOSIS — F5102 Adjustment insomnia: Secondary | ICD-10-CM | POA: Diagnosis not present

## 2019-04-05 DIAGNOSIS — I10 Essential (primary) hypertension: Secondary | ICD-10-CM

## 2019-04-05 NOTE — Progress Notes (Signed)
Subjective  CC:  Chief Complaint  Patient presents with  . Hypertension    Patient checks bp readings at home. Readings are <130/90. Patient takes her medication daily. No side effects. No HA, dizziness, or visual changes.    HPI: Ashley Duffy is a 64 y.o. female who presents to the office today to address the problems listed above in the chief complaint.  Hypertension f/u: Control is good . Pt reports she is doing well. taking medications as instructed, no medication side effects noted, no TIAs, no chest pain on exertion, no dyspnea on exertion, no swelling of ankles. She denies adverse effects from his BP medications. Compliance with medication is good.   Stress reaction: having a hard time due to political climiate and covid restrictions. Saw GYN in august: was doing poorly started low dose xanax. Doing much better now. Hopeful again. Rare xanax use. No chronic sxs of recurrent depression now. No panic. Sleep is good. Exercising which also helps  C/o left bunion intermittent pain. No injury. No other foot pain.   Assessment  1. Essential hypertension   2. Adjustment insomnia   3. Stress reaction   4. Bunion, left      Plan    Hypertension f/u: BP control is well controlled. Stable on meds. No changes.  Insomnia: much improved now.   Stress reaction: improving. Rare xanax use. Discussed management strategies. Will monitor. No indication for chronic medication now.   Bunion, left: nsaids as needed. Discussed supportive sole shoes. Education regarding management of these chronic disease states was given. Management strategies discussed on successive visits include dietary and exercise recommendations, goals of achieving and maintaining IBW, and lifestyle modifications aiming for adequate sleep and minimizing stressors.   Follow up: Return in about 15 weeks (around 07/19/2019) for complete physical, follow up Hypertension.  No orders of the defined types were placed in this  encounter.  No orders of the defined types were placed in this encounter.     BP Readings from Last 3 Encounters:  04/05/19 118/88  07/24/18 128/70  01/23/18 112/82   Wt Readings from Last 3 Encounters:  04/05/19 167 lb 9.6 oz (76 kg)  07/24/18 167 lb 3.2 oz (75.8 kg)  01/23/18 155 lb 6.4 oz (70.5 kg)    Lab Results  Component Value Date   CHOL 266 (H) 07/24/2018   CHOL 247 (H) 07/29/2017   Lab Results  Component Value Date   HDL 79.50 07/24/2018   HDL 71.00 07/29/2017   Lab Results  Component Value Date   LDLCALC 161 (H) 07/24/2018   LDLCALC 160 (H) 07/29/2017   Lab Results  Component Value Date   TRIG 127.0 07/24/2018   TRIG 80.0 07/29/2017   Lab Results  Component Value Date   CHOLHDL 3 07/24/2018   CHOLHDL 3 07/29/2017   No results found for: LDLDIRECT Lab Results  Component Value Date   CREATININE 0.84 07/24/2018   BUN 12 07/24/2018   NA 141 07/24/2018   K 3.9 07/24/2018   CL 107 07/24/2018   CO2 25 07/24/2018    The 10-year ASCVD risk score Denman George DC Jr., et al., 2013) is: 5.1%   Values used to calculate the score:     Age: 84 years     Sex: Female     Is Non-Hispanic African American: No     Diabetic: No     Tobacco smoker: No     Systolic Blood Pressure: 118 mmHg     Is  BP treated: Yes     HDL Cholesterol: 79.5 mg/dL     Total Cholesterol: 266 mg/dL  I reviewed the patients updated PMH, FH, and SocHx.    Patient Active Problem List   Diagnosis Date Noted  . Bunion, left 04/05/2019  . Overweight (BMI 25.0-29.9) 02/14/2017  . Adjustment insomnia 11/27/2015  . 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 Essential hypertension 04/13/2015  . Family history of colon cancer 04/13/2015  . History of diverticulitis 04/13/2015    Allergies: Penicillin g and Atorvastatin  Social History: Patient  reports that she has quit smoking. Her smoking use included cigarettes. She has never used smokeless tobacco. She reports that she  does not drink alcohol or use drugs.  Current Meds  Medication Sig  . ALPRAZolam (XANAX) 0.25 MG tablet Take 1 tablet (0.25 mg total) by mouth daily as needed for anxiety.  Marland Kitchen amLODipine (NORVASC) 10 MG tablet Take 1 tablet (10 mg total) by mouth daily.    Review of Systems: Cardiovascular: negative for chest pain, palpitations, leg swelling, orthopnea Respiratory: negative for SOB, wheezing or persistent cough Gastrointestinal: negative for abdominal pain Genitourinary: negative for dysuria or gross hematuria  Objective  Vitals: BP 118/88 (BP Location: Left Arm, Patient Position: Sitting, Cuff Size: Normal)   Pulse 78   Temp (!) 97.3 F (36.3 C) (Temporal)   Ht 5\' 4"  (1.626 m)   Wt 167 lb 9.6 oz (76 kg)   SpO2 99%   BMI 28.77 kg/m  General: no acute distress  Psych:  Alert and oriented, normal mood and affect HEENT:  Normocephalic, atraumatic, supple neck  Cardiovascular:  RRR without murmur. no edema Respiratory:  Good breath sounds bilaterally, CTAB with normal respiratory effort Skin:  Warm, no rashes Neurologic:   Mental status is normal Left foot: mildly tender left bunion w/o redness or warmth.   Commons side effects, risks, benefits, and alternatives for medications and treatment plan prescribed today were discussed, and the patient expressed understanding of the given instructions. Patient is instructed to call or message via MyChart if he/she has any questions or concerns regarding our treatment plan. No barriers to understanding were identified. We discussed Red Flag symptoms and signs in detail. Patient expressed understanding regarding what to do in case of urgent or emergency type symptoms.   Medication list was reconciled, printed and provided to the patient in AVS. Patient instructions and summary information was reviewed with the patient as documented in the AVS. This note was prepared with assistance of Dragon voice recognition software. Occasional wrong-word or  sound-a-like substitutions may have occurred due to the inherent limitations of voice recognition software  This visit occurred during the SARS-CoV-2 public health emergency.  Safety protocols were in place, including screening questions prior to the visit, additional usage of staff PPE, and extensive cleaning of exam room while observing appropriate contact time as indicated for disinfecting solutions.

## 2019-04-05 NOTE — Patient Instructions (Addendum)
Please return in may 2021 for your annual complete physical; please come fasting.  Look up [employer] shoes from [employer] or cross trainers with firm soles to support your bunion.   If you have any questions or concerns, please don't hesitate to send me a message via MyChart or call the office at [phone]. Thank you for visiting with [place] today! It's our pleasure caring for you.  Take care of yourself!

## 2019-06-23 IMAGING — MG DIGITAL DIAGNOSTIC UNILATERAL RIGHT MAMMOGRAM WITH TOMO AND CAD
4 series · 4 of 12 positions shown · non-contrast
Comparison: 04/21/2017, 04/17/2016 and earlier.

CLINICAL DATA: Recall from screening mammography with
tomosynthesis, possible mass involving the inner right breast at
middle depth, visible only on the CC images.

Family history of breast cancer in her mother diagnosed at age 35
and in her maternal grandmother.
EXAM:
DIGITAL DIAGNOSTIC UNILATERAL RIGHT MAMMOGRAM WITH CAD AND TOMO

[R CC synth-2D]
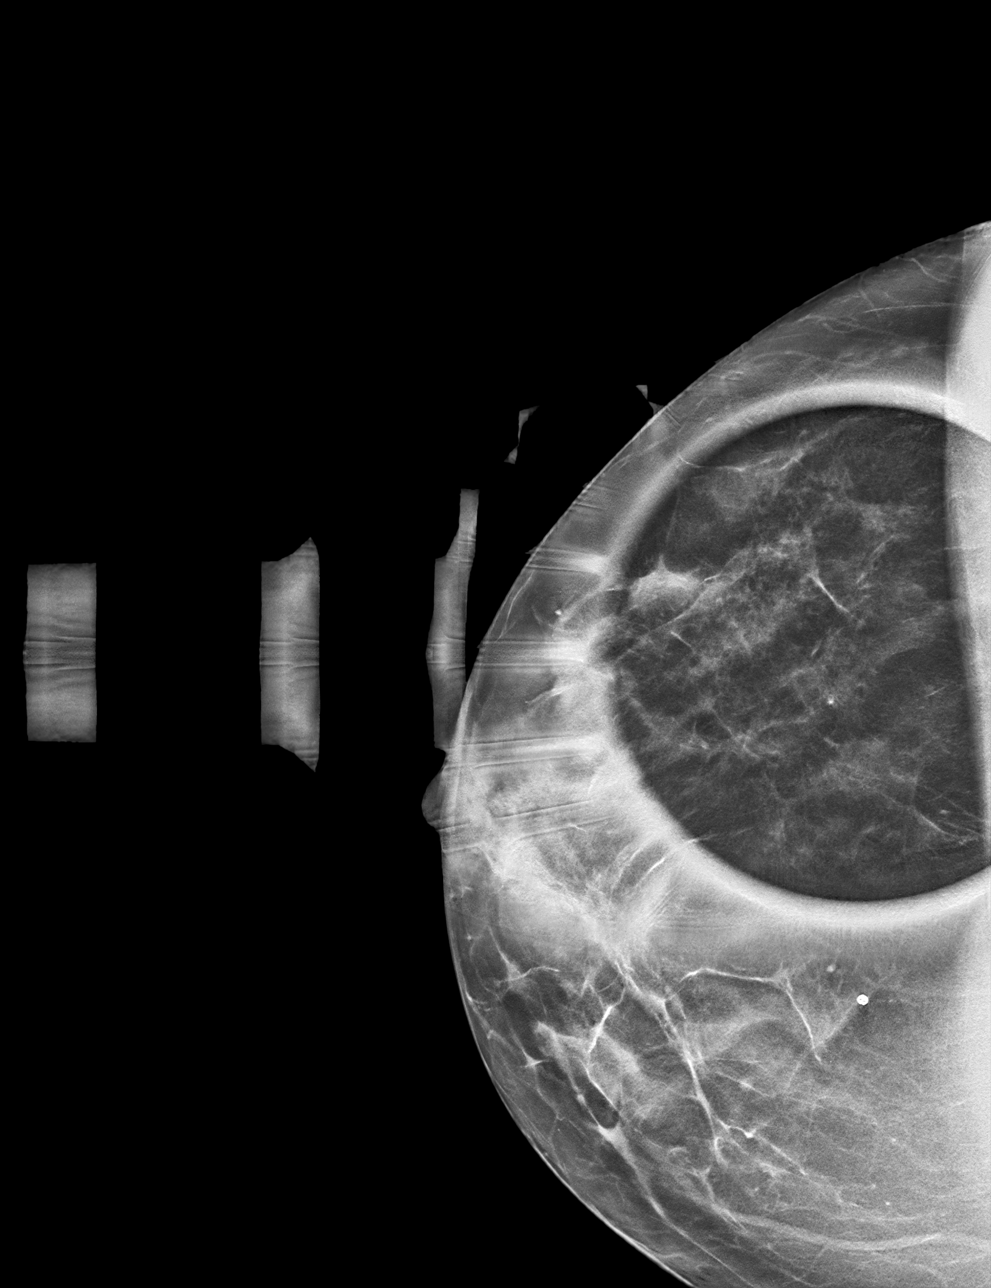

[R ML synth-2D]
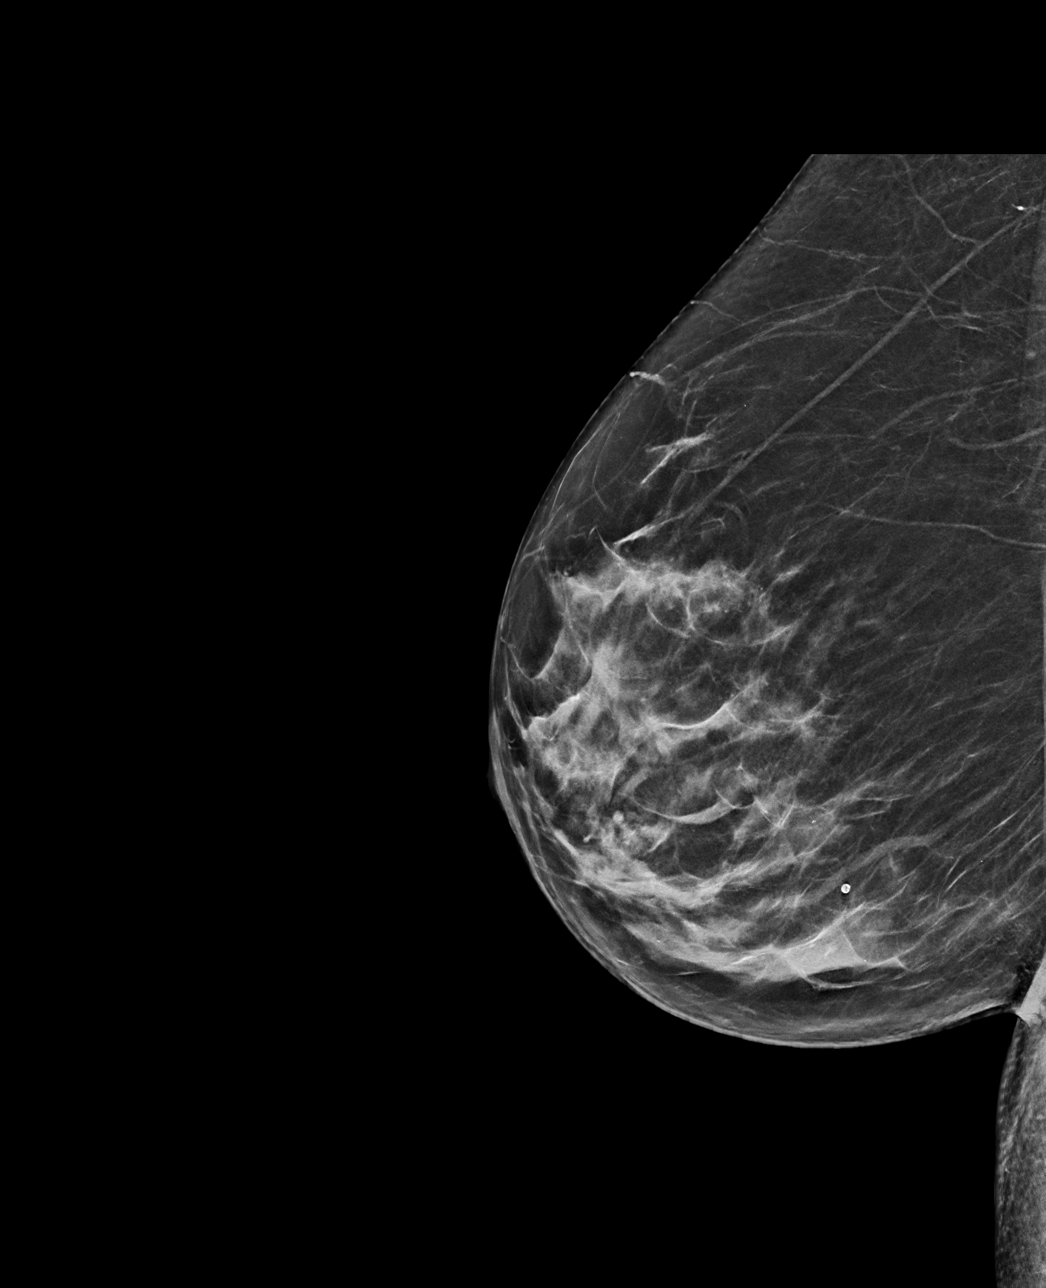

[R CC tomo · tomo slice 25/48.0]
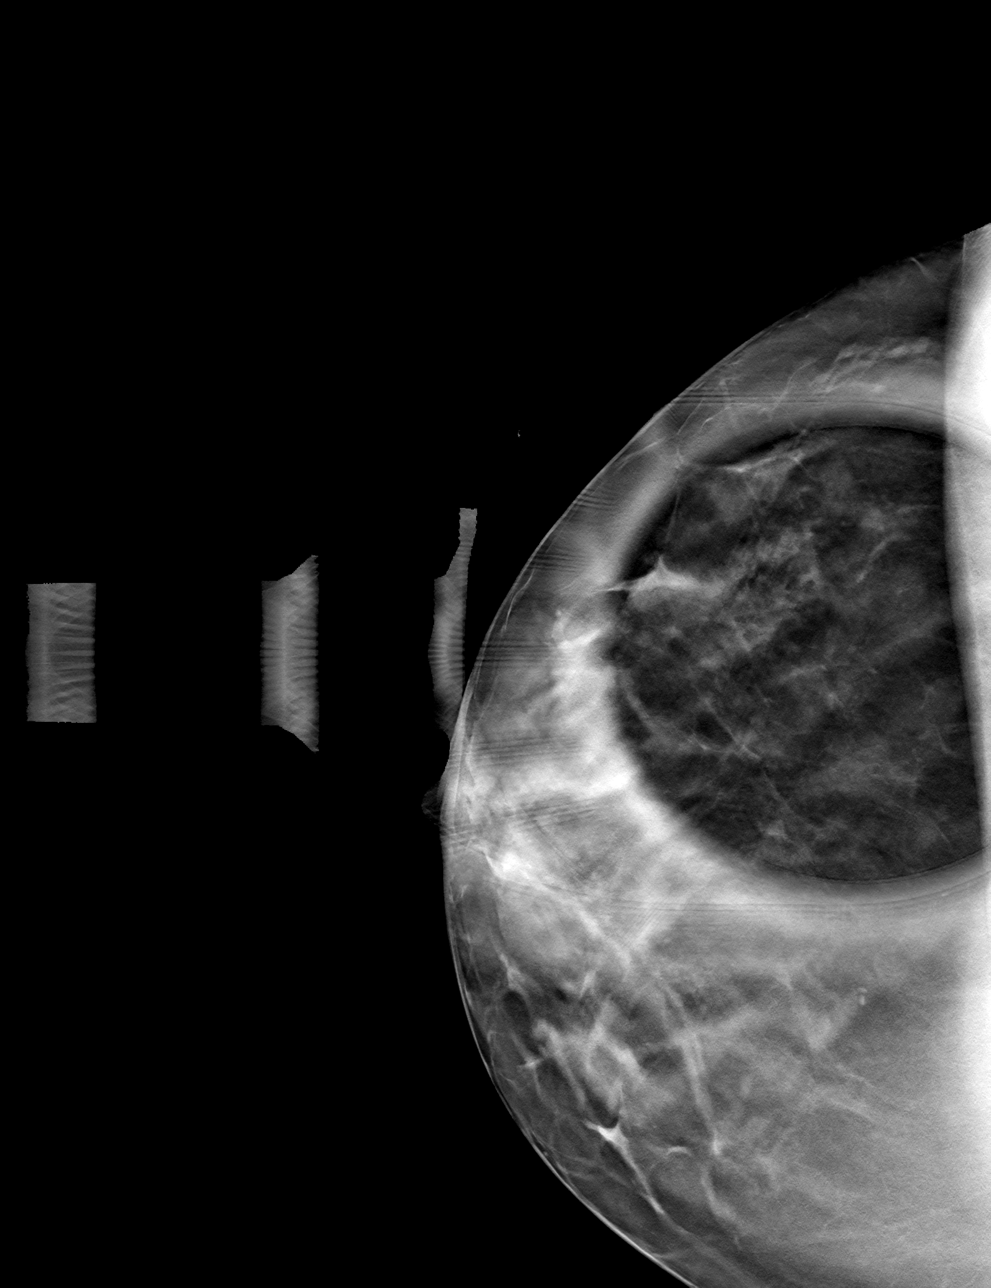

[R ML tomo · tomo slice 33/65.0]
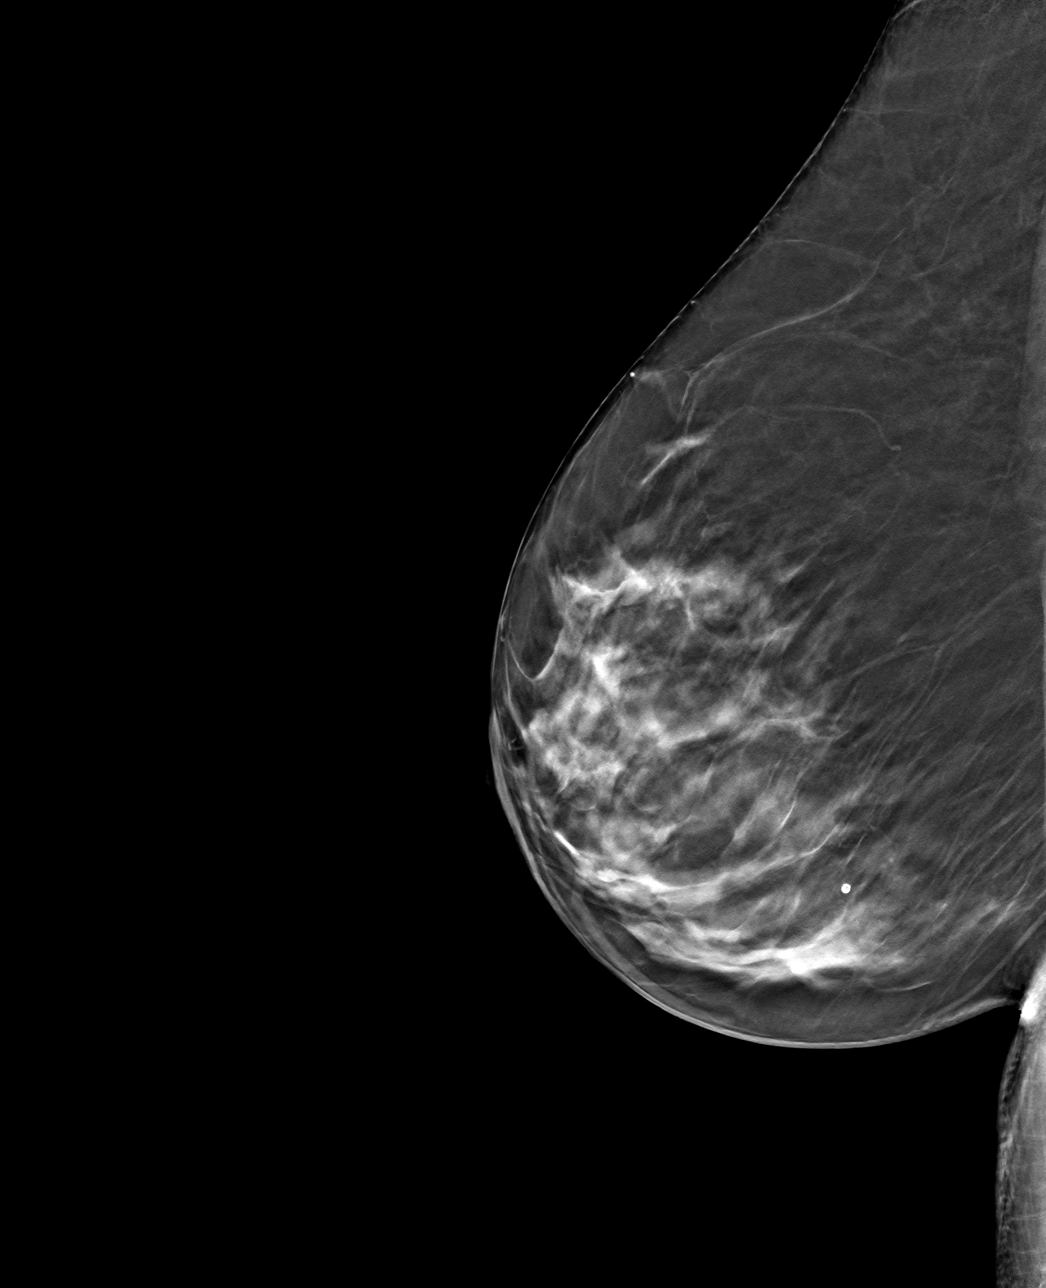

[4 of 12 positions shown; findings below may reference images not displayed]

ACR Breast Density Category c: The breast tissue is heterogeneously
dense, which may obscure small masses.
FINDINGS: Tomosynthesis and synthesized spot-compression CC view of the area
of concern in the right breast and tomosynthesis and synthesized
full field mediolateral view of the right breast were obtained.

The screening mammographic finding disperses with compression and
there is no underlying mass or architectural distortion.

No findings suspicious for malignancy in the right breast.

Mammographic images were processed with CAD.
IMPRESSION: No mammographic evidence of malignancy involving the right breast.

RECOMMENDATION:
Screening mammogram in one year.(Code:[id number]-4-85Y)

I have discussed the findings and recommendations with the patient.
Results were also provided in writing at the conclusion of the
visit. If applicable, a reminder letter will be sent to the patient
regarding the next appointment.

BI-RADS CATEGORY  1: Negative.

## 2019-07-28 ENCOUNTER — Other Ambulatory Visit: Payer: Self-pay

## 2019-07-28 ENCOUNTER — Encounter: Payer: Self-pay | Admitting: Family Medicine

## 2019-07-28 ENCOUNTER — Ambulatory Visit (INDEPENDENT_AMBULATORY_CARE_PROVIDER_SITE_OTHER): Payer: BC Managed Care – PPO | Admitting: Family Medicine

## 2019-07-28 VITALS — BP 114/78 | HR 75 | Temp 97.3°F | Resp 15 | Ht 67.0 in | Wt 159.0 lb

## 2019-07-28 DIAGNOSIS — M21612 Bunion of left foot: Secondary | ICD-10-CM | POA: Diagnosis not present

## 2019-07-28 DIAGNOSIS — I10 Essential (primary) hypertension: Secondary | ICD-10-CM | POA: Diagnosis not present

## 2019-07-28 DIAGNOSIS — Z0000 Encounter for general adult medical examination without abnormal findings: Secondary | ICD-10-CM | POA: Diagnosis not present

## 2019-07-28 LAB — CBC WITH DIFFERENTIAL/PLATELET
Basophils Absolute: 0 10*3/uL (ref 0.0–0.1)
Basophils Relative: 0.4 % (ref 0.0–3.0)
Eosinophils Absolute: 0.1 10*3/uL (ref 0.0–0.7)
Eosinophils Relative: 1.6 % (ref 0.0–5.0)
HCT: 41.1 % (ref 36.0–46.0)
Hemoglobin: 13.7 g/dL (ref 12.0–15.0)
Lymphocytes Relative: 31.9 % (ref 12.0–46.0)
Lymphs Abs: 2.3 10*3/uL (ref 0.7–4.0)
MCHC: 33.2 g/dL (ref 30.0–36.0)
MCV: 89.7 fl (ref 78.0–100.0)
Monocytes Absolute: 0.6 10*3/uL (ref 0.1–1.0)
Monocytes Relative: 8.1 % (ref 3.0–12.0)
Neutro Abs: 4.2 10*3/uL (ref 1.4–7.7)
Neutrophils Relative %: 58 % (ref 43.0–77.0)
Platelets: 384 10*3/uL (ref 150.0–400.0)
RBC: 4.58 Mil/uL (ref 3.87–5.11)
RDW: 15.1 % (ref 11.5–15.5)
WBC: 7.2 10*3/uL (ref 4.0–10.5)

## 2019-07-28 LAB — COMPREHENSIVE METABOLIC PANEL
ALT: 27 U/L (ref 0–35)
AST: 18 U/L (ref 0–37)
Albumin: 4.7 g/dL (ref 3.5–5.2)
Alkaline Phosphatase: 85 U/L (ref 39–117)
BUN: 15 mg/dL (ref 6–23)
CO2: 24 mEq/L (ref 19–32)
Calcium: 10.6 mg/dL — ABNORMAL HIGH (ref 8.4–10.5)
Chloride: 105 mEq/L (ref 96–112)
Creatinine, Ser: 0.78 mg/dL (ref 0.40–1.20)
GFR: 74.3 mL/min (ref >60.00)
Glucose, Bld: 94 mg/dL (ref 70–99)
Potassium: 4.2 mEq/L (ref 3.5–5.1)
Sodium: 138 mEq/L (ref 135–145)
Total Bilirubin: 0.5 mg/dL (ref 0.2–1.2)
Total Protein: 7 g/dL (ref 6.0–8.3)

## 2019-07-28 LAB — LIPID PANEL
Cholesterol: 279 mg/dL — ABNORMAL HIGH (ref 0–200)
HDL: 78.5 mg/dL (ref >39.00)
LDL Cholesterol: 181 mg/dL — ABNORMAL HIGH (ref 0–99)
NonHDL: 200.3
Total CHOL/HDL Ratio: 4
Triglycerides: 97 mg/dL (ref 0.0–149.0)
VLDL: 19.4 mg/dL (ref 0.0–40.0)

## 2019-07-28 MED ORDER — AMLODIPINE BESYLATE 10 MG PO TABS: 10.0000 mg | ORAL_TABLET | Freq: Every day | ORAL | 3 refills | Status: DC

## 2019-07-28 NOTE — Progress Notes (Signed)
Subjective  Chief Complaint  Patient presents with  . Annual Exam    left foot bunion   . Hypertension  . Anxiety    HPI: Ashley Duffy is a 64 y.o. female who presents to [facility] [place] today for a Female Wellness Visit. She also has the concerns and/or needs as listed above in the chief complaint. These will be addressed in addition to the Health Maintenance Visit.   Wellness Visit: annual visit with health maintenance review and exam without Pap   HM: mammo due next month. Pap up to date. imms up to date. Doing well Chronic disease f/u and/or acute problem visit: (deemed necessary to be done in addition to the wellness visit):  Hypertension f/u: Control is good . Pt reports she is doing well. taking medications as instructed, no medication side effects noted, no TIAs, no chest pain on exertion, no dyspnea on exertion, no swelling of ankles. Normal home readings. Stable for years on amlodipine.  She denies adverse effects from his BP medications. Compliance with medication is good.   Anxiety / stress reaction: resolved since getting the covid vaccines, life is normalizing and she is coping much better. No longer needing xanax.  Left bunion: growing and more painful in spite of conservative measures. Former Horticulturist, commercial.   BP Readings from Last 3 Encounters:  07/28/19 114/78  04/05/19 118/88  07/24/18 128/70   Wt Readings from Last 3 Encounters:  07/28/19 159 lb (72.1 kg)  04/05/19 167 lb (75.8 kg)  04/05/19 167 lb 9.6 oz (76 kg)    Lab Results  Component Value Date   CHOL 266 (H) 07/24/2018   CHOL 247 (H) 07/29/2017   Lab Results  Component Value Date   HDL 79.50 07/24/2018   HDL 71.00 07/29/2017   Lab Results  Component Value Date   LDLCALC 161 (H) 07/24/2018   LDLCALC 160 (H) 07/29/2017   Lab Results  Component Value Date   TRIG 127.0 07/24/2018   TRIG 80.0 07/29/2017   Lab Results  Component Value Date   CHOLHDL 3 07/24/2018    CHOLHDL 3 07/29/2017   No results found for: LDLDIRECT Lab Results  Component Value Date   CREATININE 0.84 07/24/2018   BUN 12 07/24/2018   NA 141 07/24/2018   K 3.9 07/24/2018   CL 107 07/24/2018   CO2 25 07/24/2018    The 10-year ASCVD risk score Denman George DC Jr., et al., 2013) is: 5.3%   Values used to calculate the score:     Age: 66 years     Sex: Female     Is Non-Hispanic African American: No     Diabetic: No     Tobacco smoker: No     Systolic Blood Pressure: 114 mmHg     Is BP treated: Yes     HDL Cholesterol: 79.5 mg/dL     Total Cholesterol: 266 mg/dL   Assessment  1. Annual physical exam   2. Essential hypertension   3. Bunion, left      Plan  Female Wellness Visit:  Age appropriate Health Maintenance and Prevention measures were discussed with patient. Included topics are cancer screening recommendations, ways to keep healthy (see AVS) including dietary and exercise recommendations, regular eye and dental care, use of seat belts, and avoidance of moderate alcohol use and tobacco use.   BMI: discussed patient's BMI and encouraged positive lifestyle modifications to help get to or maintain a target BMI.  HM needs and  immunizations were addressed and ordered. See below for orders. See HM and immunization section for updates.  Routine labs and screening tests ordered including cmp, cbc and lipids where appropriate.  Discussed recommendations regarding Vit D and calcium supplementation (see AVS)  Chronic disease management visit and/or acute problem visit:  HTN is well controlled. Check labs.  Refer to podiatry for bunion eval.   Follow up: Return in about 1 year (around 07/27/2020) for complete physical, follow up Hypertension.  Orders Placed This Encounter  Procedures  . CBC with Differential/Platelet  . Comprehensive metabolic panel  . Lipid panel   Meds ordered this encounter  Medications  . amLODipine (NORVASC) 10 MG tablet    Sig: Take 1 tablet  (10 mg total) by mouth daily.    Dispense:  90 tablet    Refill:  3      Lifestyle: Body mass index is 24.9 kg/m. Wt Readings from Last 3 Encounters:  07/28/19 159 lb (72.1 kg)  04/05/19 167 lb (75.8 kg)  04/05/19 167 lb 9.6 oz (76 kg)    Patient Active Problem List   Diagnosis Date Noted  . Essential hypertension 04/13/2015    Priority: High  . Family history of colon cancer 04/13/2015    Priority: High    Overview:  Father 31   . Adjustment insomnia 11/27/2015    Priority: Medium  . Bunion, left 04/05/2019    Priority: Low  . 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 History of diverticulitis 04/13/2015    Priority: Low   Health Maintenance  Topic Date Due  . INFLUENZA VACCINE  10/03/2019  . MAMMOGRAM  10/05/2019  . PAP SMEAR-Modifier  10/02/2021  . COLONOSCOPY  02/26/2022  . TETANUS/TDAP  05/25/2025  . COVID-19 Vaccine  Completed  . Hepatitis C Screening  Completed  . HIV Screening  Completed   Immunization History  Administered Date(s) Administered  . Influenza, Seasonal, Injecte, Preservative Fre 01/12/2003, 11/27/2015  . Influenza,inj,Quad PF,6+ Mos 02/14/2017, 01/23/2018, 12/17/2018  . PFIZER SARS-COV-2 Vaccination 05/19/2019, 06/09/2019  . Pneumococcal Polysaccharide-23 08/06/2012  . Tdap 05/26/2015  . Zoster 05/26/2015  . Zoster Recombinat (Shingrix) 01/23/2018   We updated and reviewed the patient's past history in detail and it is documented below. Allergies: Patient is allergic to penicillin g and atorvastatin. Past Medical History Patient  has a past medical history of Anxiety, Colon polyp, Depression, Diverticulitis, Diverticulosis, Hyperlipidemia, Hypertension, and Panic attack as reaction to stress (11/27/2015). Past Surgical History Patient  has a past surgical history that includes Lumbar disc surgery (1998); Appendectomy; Colonoscopy (2007); and Breast biopsy (Left, over 10 yrs ago). Family  History: Patient family history includes Alcohol abuse in her father; Alzheimer's disease in her mother; Arthritis in her brother; Breast cancer (age of onset: 18) in her mother; Breast cancer (age of onset: 37) in her maternal grandmother; Breast cancer (age of onset: 95) in her maternal aunt; Colon cancer in her father; Hypertension in her brother and mother; Thyroid disease in her mother. Social History:  Patient  reports that she has quit smoking. Her smoking use included cigarettes. She has never used smokeless tobacco. She reports that she does not drink alcohol or use drugs.  Review of Systems: Constitutional: negative for fever or malaise Ophthalmic: negative for photophobia, double vision or loss of vision Cardiovascular: negative for chest pain, dyspnea on exertion, or new LE swelling Respiratory: negative for SOB or persistent cough Gastrointestinal: negative for abdominal pain,  change in bowel habits or melena Genitourinary: negative for dysuria or gross hematuria, no abnormal uterine bleeding or disharge Musculoskeletal: negative for new gait disturbance or muscular weakness Integumentary: negative for new or persistent rashes, no breast lumps Neurological: negative for TIA or stroke symptoms Psychiatric: negative for SI or delusions Allergic/Immunologic: negative for hives  Patient Care Team    Relationship Specialty Notifications Start End  Leamon Arnt, MD PCP - General Family Medicine  02/14/17   Lady Gary, [facility] Consulting Physician Gynecology  02/14/17    Comment: Dr. Hilary Hertz, Luanna Cole, MD Consulting Physician Gastroenterology  04/05/19     Objective  Vitals: BP 114/78   Pulse 75   Temp (!) 97.3 F (36.3 C) (Temporal)   Resp 15   Ht 5\' 7"  (1.702 m)   Wt 159 lb (72.1 kg)   SpO2 98%   BMI 24.90 kg/m  General:  Well developed, well nourished, no acute distress  Psych:  Alert and orientedx3,normal mood and affect HEENT:  Normocephalic,  atraumatic, non-icteric sclera,  supple neck without adenopathy, mass or thyromegaly Cardiovascular:  Normal S1, S2, RRR without gallop, rub or murmur Respiratory:  Good breath sounds bilaterally, CTAB with normal respiratory effort Gastrointestinal: normal bowel sounds, soft, non-tender, no noted masses. No HSM MSK: left bunion, tender. no contusions. Other joints are without erythema or swelling.  Skin:  Warm, no rashes or suspicious lesions noted Neurologic:    Mental status is normal. CN 2-11 are normal. Gross motor and sensory exams are normal. Normal gait. No tremor Breast Exam: No mass, skin retraction or nipple discharge is appreciated in either breast. No axillary adenopathy. Fibrocystic changes are not noted    Commons side effects, risks, benefits, and alternatives for medications and treatment plan prescribed today were discussed, and the patient expressed understanding of the given instructions. Patient is instructed to call or message via MyChart if he/she has any questions or concerns regarding our treatment plan. No barriers to understanding were identified. We discussed Red Flag symptoms and signs in detail. Patient expressed understanding regarding what to do in case of urgent or emergency type symptoms.   Medication list was reconciled, printed and provided to the patient in AVS. Patient instructions and summary information was reviewed with the patient as documented in the AVS. This note was prepared with assistance of Dragon voice recognition software. Occasional wrong-word or sound-a-like substitutions may have occurred due to the inherent limitations of voice recognition software  This visit occurred during the SARS-CoV-2 public health emergency.  Safety protocols were in place, including screening questions prior to the visit, additional usage of staff PPE, and extensive cleaning of exam room while observing appropriate contact time as indicated for disinfecting solutions.

## 2019-07-28 NOTE — Patient Instructions (Signed)
Please return in 12 months for your annual complete physical; please come fasting.  Keep an eye on your blood pressure. It look great.  I have placed a referral to a podiatrist to get some help with your bunion. We will call you to get you scheduled.  If you have any questions or concerns, please don't hesitate to send me a message via MyChart or call the office at [phone]. Thank you for visiting with [place] today! It's our pleasure caring for you.   Preventive Care 40-80 Years Old, Female Preventive care refers to visits with your health care provider and lifestyle choices that can promote health and wellness. This includes:  A yearly physical exam. This may also be called an annual well check.  Regular dental visits and eye exams.  Immunizations.  Screening for certain conditions.  Healthy lifestyle choices, such as eating a healthy diet, getting regular exercise, not using drugs or products that contain nicotine and tobacco, and limiting alcohol use. What can I expect for my preventive care visit? Physical exam Your health care provider will check your:  Height and weight. This may be used to calculate body mass index (BMI), which tells if you are at a healthy weight.  Heart rate and blood pressure.  Skin for abnormal spots. Counseling Your health care provider may ask you questions about your:  Alcohol, tobacco, and drug use.  Emotional well-being.  Home and relationship well-being.  Sexual activity.  Eating habits.  Work and work Statistician.  Method of birth control.  Menstrual cycle.  Pregnancy history. What immunizations do I need?  Influenza (flu) vaccine  This is recommended every year. Tetanus, diphtheria, and pertussis (Tdap) vaccine  You may need a Td booster every 10 years. Varicella (chickenpox) vaccine  You may need this if you have not been vaccinated. Zoster (shingles) vaccine  You may need this after age 79. Measles, mumps, and  rubella (MMR) vaccine  You may need at least one dose of MMR if you were born in 1957 or later. You may also need a second dose. Pneumococcal conjugate (PCV13) vaccine  You may need this if you have certain conditions and were not previously vaccinated. Pneumococcal polysaccharide (PPSV23) vaccine  You may need one or two doses if you smoke cigarettes or if you have certain conditions. Meningococcal conjugate (MenACWY) vaccine  You may need this if you have certain conditions. Hepatitis A vaccine  You may need this if you have certain conditions or if you travel or work in places where you may be exposed to hepatitis A. Hepatitis B vaccine  You may need this if you have certain conditions or if you travel or work in places where you may be exposed to hepatitis B. Haemophilus influenzae type b (Hib) vaccine  You may need this if you have certain conditions. Human papillomavirus (HPV) vaccine  If recommended by your health care provider, you may need three doses over 6 months. You may receive vaccines as individual doses or as more than one vaccine together in one shot (combination vaccines). Talk with your health care provider about the risks and benefits of combination vaccines. What tests do I need? Blood tests  Lipid and cholesterol levels. These may be checked every 5 years, or more frequently if you are over 51 years old.  Hepatitis C test.  Hepatitis B test. Screening  Lung cancer screening. You may have this screening every year starting at age 74 if you have a 30-pack-year history of smoking and  currently smoke or have quit within the past 15 years.  Colorectal cancer screening. All adults should have this screening starting at age 49 and continuing until age 77. Your health care provider may recommend screening at age 60 if you are at increased risk. You will have tests every 1-10 years, depending on your results and the type of screening test.  Diabetes screening. This  is done by checking your blood sugar (glucose) after you have not eaten for a while (fasting). You may have this done every 1-3 years.  Mammogram. This may be done every 1-2 years. Talk with your health care provider about when you should start having regular mammograms. This may depend on whether you have a family history of breast cancer.  BRCA-related cancer screening. This may be done if you have a family history of breast, ovarian, tubal, or peritoneal cancers.  Pelvic exam and Pap test. This may be done every 3 years starting at age 3. Starting at age 38, this may be done every 5 years if you have a Pap test in combination with an HPV test. Other tests  Sexually transmitted disease (STD) testing.  Bone density scan. This is done to screen for osteoporosis. You may have this scan if you are at high risk for osteoporosis. Follow these instructions at home: Eating and drinking  Eat a diet that includes fresh fruits and vegetables, whole grains, lean protein, and low-fat dairy.  Take vitamin and mineral supplements as recommended by your health care provider.  Do not drink alcohol if: ? Your health care provider tells you not to drink. ? You are pregnant, may be pregnant, or are planning to become pregnant.  If you drink alcohol: ? Limit how much you have to 0-1 drink a day. ? Be aware of how much alcohol is in your drink. In the [place] one drink equals one 12 oz bottle of beer (355 mL), one 5 oz glass of wine (148 mL), or one 1 oz glass of hard liquor (44 mL). Lifestyle  Take daily care of your teeth and gums.  Stay active. Exercise for at least 30 minutes on 5 or more days each week.  Do not use any products that contain nicotine or tobacco, such as cigarettes, e-cigarettes, and chewing tobacco. If you need help quitting, ask your health care provider.  If you are sexually active, practice safe sex. Use a condom or other form of birth control (contraception) in order to prevent  pregnancy and STIs (sexually transmitted infections).  If told by your health care provider, take low-dose aspirin daily starting at age 36. What's next?  Visit your health care provider once a year for a well check visit.  Ask your health care provider how often you should have your eyes and teeth checked.  Stay up to date on all vaccines. This information is not intended to replace advice given to you by your health care provider. Make sure you discuss any questions you have with your health care provider. Document Revised: 10/30/2017 Document Reviewed: 10/30/2017 Elsevier Patient Education  2020 [employer].

## 2019-08-03 DIAGNOSIS — H01009 Unspecified blepharitis unspecified eye, unspecified eyelid: Secondary | ICD-10-CM | POA: Diagnosis not present

## 2020-01-04 DIAGNOSIS — Z01419 Encounter for gynecological examination (general) (routine) without abnormal findings: Secondary | ICD-10-CM | POA: Diagnosis not present

## 2020-01-04 DIAGNOSIS — Z6827 Body mass index (BMI) 27.0-27.9, adult: Secondary | ICD-10-CM | POA: Diagnosis not present

## 2020-05-02 DIAGNOSIS — Z1231 Encounter for screening mammogram for malignant neoplasm of breast: Secondary | ICD-10-CM | POA: Diagnosis not present

## 2020-07-24 ENCOUNTER — Encounter: Payer: BC Managed Care – PPO | Admitting: Family Medicine

## 2020-07-26 ENCOUNTER — Other Ambulatory Visit: Payer: Self-pay | Admitting: Family Medicine

## 2020-07-28 ENCOUNTER — Ambulatory Visit (INDEPENDENT_AMBULATORY_CARE_PROVIDER_SITE_OTHER): Payer: Medicare Other | Admitting: Family Medicine

## 2020-07-28 ENCOUNTER — Other Ambulatory Visit: Payer: Self-pay

## 2020-07-28 ENCOUNTER — Encounter: Payer: Self-pay | Admitting: Family Medicine

## 2020-07-28 VITALS — BP 128/80 | HR 67 | Temp 98.7°F | Ht 67.0 in | Wt 174.0 lb

## 2020-07-28 DIAGNOSIS — Z23 Encounter for immunization: Secondary | ICD-10-CM

## 2020-07-28 DIAGNOSIS — Z0000 Encounter for general adult medical examination without abnormal findings: Secondary | ICD-10-CM | POA: Diagnosis not present

## 2020-07-28 DIAGNOSIS — Z800 Family history of malignant neoplasm of digestive organs: Secondary | ICD-10-CM

## 2020-07-28 DIAGNOSIS — I10 Essential (primary) hypertension: Secondary | ICD-10-CM | POA: Diagnosis not present

## 2020-07-28 DIAGNOSIS — F4322 Adjustment disorder with anxiety: Secondary | ICD-10-CM | POA: Insufficient documentation

## 2020-07-28 DIAGNOSIS — M21612 Bunion of left foot: Secondary | ICD-10-CM

## 2020-07-28 DIAGNOSIS — F5102 Adjustment insomnia: Secondary | ICD-10-CM | POA: Diagnosis not present

## 2020-07-28 LAB — CBC WITH DIFFERENTIAL/PLATELET
Basophils Absolute: 0 10*3/uL (ref 0.0–0.1)
Basophils Relative: 0.4 % (ref 0.0–3.0)
Eosinophils Absolute: 0.2 10*3/uL (ref 0.0–0.7)
Eosinophils Relative: 2.1 % (ref 0.0–5.0)
HCT: 41.8 % (ref 36.0–46.0)
Hemoglobin: 14 g/dL (ref 12.0–15.0)
Lymphocytes Relative: 19.9 % (ref 12.0–46.0)
Lymphs Abs: 1.8 10*3/uL (ref 0.7–4.0)
MCHC: 33.4 g/dL (ref 30.0–36.0)
MCV: 89.8 fl (ref 78.0–100.0)
Monocytes Absolute: 0.7 10*3/uL (ref 0.1–1.0)
Monocytes Relative: 7.4 % (ref 3.0–12.0)
Neutro Abs: 6.4 10*3/uL (ref 1.4–7.7)
Neutrophils Relative %: 70.2 % (ref 43.0–77.0)
Platelets: 381 10*3/uL (ref 150.0–400.0)
RBC: 4.66 Mil/uL (ref 3.87–5.11)
RDW: 14.8 % (ref 11.5–15.5)
WBC: 9.1 10*3/uL (ref 4.0–10.5)

## 2020-07-28 LAB — LIPID PANEL
Cholesterol: 274 mg/dL — ABNORMAL HIGH (ref 0–200)
HDL: 72 mg/dL (ref >39.00)
LDL Cholesterol: 171 mg/dL — ABNORMAL HIGH (ref 0–99)
NonHDL: 202.48
Total CHOL/HDL Ratio: 4
Triglycerides: 159 mg/dL — ABNORMAL HIGH (ref 0.0–149.0)
VLDL: 31.8 mg/dL (ref 0.0–40.0)

## 2020-07-28 LAB — COMPREHENSIVE METABOLIC PANEL
ALT: 24 U/L (ref 0–35)
AST: 20 U/L (ref 0–37)
Albumin: 4.6 g/dL (ref 3.5–5.2)
Alkaline Phosphatase: 77 U/L (ref 39–117)
BUN: 9 mg/dL (ref 6–23)
CO2: 26 mEq/L (ref 19–32)
Calcium: 10.5 mg/dL (ref 8.4–10.5)
Chloride: 105 mEq/L (ref 96–112)
Creatinine, Ser: 0.7 mg/dL (ref 0.40–1.20)
GFR: 90.88 mL/min (ref >60.00)
Glucose, Bld: 91 mg/dL (ref 70–99)
Potassium: 3.9 mEq/L (ref 3.5–5.1)
Sodium: 140 mEq/L (ref 135–145)
Total Bilirubin: 0.5 mg/dL (ref 0.2–1.2)
Total Protein: 7 g/dL (ref 6.0–8.3)

## 2020-07-28 MED ORDER — AMLODIPINE BESYLATE 10 MG PO TABS: 1.0000 | ORAL_TABLET | Freq: Every day | ORAL | 3 refills | Status: DC

## 2020-07-28 MED ORDER — DICLOFENAC SODIUM 1 % EX GEL: 2.0000 g | Freq: Four times a day (QID) | CUTANEOUS | 5 refills | Status: DC | PRN

## 2020-07-28 MED ORDER — ALPRAZOLAM 0.25 MG PO TABS: 0.2500 mg | ORAL_TABLET | Freq: Every day | ORAL | 2 refills | Status: DC | PRN

## 2020-07-28 NOTE — Progress Notes (Signed)
Subjective  Chief Complaint  Patient presents with  . Annual Exam    Fasting  . Hypertension  . Left foot    Has a bunion      HPI: Ashley Duffy is a 65 y.o. female who presents to [facility] today for a Female Wellness Visit. She also has the concerns and/or needs as listed above in the chief complaint. These will be addressed in addition to the Health Maintenance Visit.   Wellness Visit: annual visit with health maintenance review and exam without Pap   Health maintenance: Patient sees GYN.  screens up-to-date.  Eligible for Shingrix and Pneumovax.  Reports normal bone density 2 years ago. Chronic disease f/u and/or acute problem visit: (deemed necessary to be done in addition to the wellness visit):  Hypertension: Does well on amlodipine 10 mg daily.  No adverse effects.  No chest pain or shortness of breath.  Blood pressures been well controlled.  Left bunion, chronic.  Intermittent pain.  Does not take anything for pain.  Has questions about conservative care.  Not interested in surgery at this time.    Anxiety/insomnia: She has had intermittent bouts with adjustment anxiety and poor sleep.  For the last 5 to 6 months, things are active again.  Mostly triggered by difficult willed environment, economy, politics etc.  She uses Xanax intermittently.  Requests refill.  She does report that many years ago she tried SSRIs, 3 or 4 of them but never had good success.  Would like to defer medications at this time.  Denies significant depressive symptoms.  No panic attacks.  Family history of colon cancer: Colonoscopies are up-to-date.  She gets them every 5 years.  Assessment  1. Annual physical exam   2. Essential hypertension   3. Bunion, left   4. Family history of colon cancer   5. Adjustment insomnia   6. Adjustment disorder with anxiety      Plan  Female Wellness Visit:  Age appropriate Health Maintenance and Prevention measures were  discussed with patient. Included topics are cancer screening recommendations, ways to keep healthy (see AVS) including dietary and exercise recommendations, regular eye and dental care, use of seat belts, and avoidance of moderate alcohol use and tobacco use. Screens are up to date   BMI: discussed patient's BMI and encouraged positive lifestyle modifications to help get to or maintain a target BMI.  HM needs and immunizations were addressed and ordered. See below for orders. See HM and immunization section for updates.shingrix #2 given along with pneumovax today.  Routine labs and screening tests ordered including cmp, cbc and lipids where appropriate.  Discussed recommendations regarding Vit D and calcium supplementation (see AVS)  Chronic disease management visit and/or acute problem visit:  HTN: well controlled. Refilled amlodipine 10. Check renal function and electrolytes.  Bunion: supportive care recommended. advil and/or topical voltaren gel discussed for treatment of painful flares. Supportive soled shoes  Adjustment disorder with insomnia and anxiety: counseling done. Pt defers treatment daily at this time. Discussed use fo xanax 0.25 intermittently as needed. Discussed behavioral management strategies as well.   Colon ca screens are up to date.   Follow up: 12 mo for cpe, sooner if needed for anxiety  Orders Placed This Encounter  Procedures  . Varicella-zoster vaccine IM (Shingrix)  . Pneumococcal polysaccharide vaccine 23-valent greater than or equal to 2yo subcutaneous/IM  . CBC with Differential/Platelet  . Comprehensive metabolic panel  . Lipid panel  Meds ordered this encounter  Medications  . amLODipine (NORVASC) 10 MG tablet    Sig: Take 1 tablet (10 mg total) by mouth daily.    Dispense:  90 tablet    Refill:  3  . diclofenac Sodium (VOLTAREN) 1 % GEL    Sig: Apply 2 g topically 4 (four) times daily as needed (bunion pain).    Dispense:  150 g    Refill:  5   . ALPRAZolam (XANAX) 0.25 MG tablet    Sig: Take 1 tablet (0.25 mg total) by mouth daily as needed.    Dispense:  30 tablet    Refill:  2      Body mass index is 27.25 kg/m. Wt Readings from Last 3 Encounters:  07/28/20 174 lb (78.9 kg)  07/28/19 159 lb (72.1 kg)  04/05/19 167 lb (75.8 kg)     Patient Active Problem List   Diagnosis Date Noted  . Essential hypertension 04/13/2015    Priority: High  . Family history of colon cancer 04/13/2015    Priority: High    Overview:  Father 12   . Adjustment insomnia 11/27/2015    Priority: Medium  . Bunion, left 04/05/2019    Priority: Low  . 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 History of diverticulitis 04/13/2015    Priority: Low  . Adjustment disorder with anxiety 07/28/2020   Health Maintenance  Topic Date Due  . INFLUENZA VACCINE  10/02/2020  . DEXA SCAN  03/04/2021  . MAMMOGRAM  05/02/2021  . PNA vac Low Risk Adult (2 of 2 - PCV13) 07/28/2021  . PAP SMEAR-Modifier  10/02/2021  . COLONOSCOPY (Pts 45-49yrs Insurance coverage will need to be confirmed)  02/26/2022  . TETANUS/TDAP  05/25/2025  . COVID-19 Vaccine  Completed  . Hepatitis C Screening  Completed  . HIV Screening  Completed  . Zoster Vaccines- Shingrix  Completed  . HPV VACCINES  Aged Out   Immunization History  Administered Date(s) Administered  . Influenza, Seasonal, Injecte, Preservative Fre 01/12/2003, 11/27/2015  . Influenza,inj,Quad PF,6+ Mos 02/14/2017, 01/23/2018, 12/17/2018  . PFIZER(Purple Top)SARS-COV-2 Vaccination 05/19/2019, 06/09/2019, 12/22/2019  . Pneumococcal Polysaccharide-23 08/06/2012, 07/28/2020  . Tdap 05/26/2015  . Zoster Recombinat (Shingrix) 01/23/2018, 07/28/2020  . Zoster, Live 05/26/2015   We updated and reviewed the patient's past history in detail and it is documented below. Allergies: Patient is allergic to penicillin g and atorvastatin. Past Medical History Patient  has a past  medical history of Anxiety, Colon polyp, Depression, Diverticulitis, Diverticulosis, Hyperlipidemia, Hypertension, and Panic attack as reaction to stress (11/27/2015). Past Surgical History Patient  has a past surgical history that includes Lumbar disc surgery (1998); Appendectomy; Colonoscopy (2007); and Breast biopsy (Left, over 10 yrs ago). Family History: Patient family history includes Alcohol abuse in her father; Alzheimer's disease in her mother; Arthritis in her brother; Breast cancer (age of onset: 3) in her mother; Breast cancer (age of onset: 110) in her maternal grandmother; Breast cancer (age of onset: 30) in her maternal aunt; Colon cancer in her father; Hypertension in her brother and mother; Thyroid disease in her mother. Social History:  Patient  reports that she has quit smoking. Her smoking use included cigarettes. She has never used smokeless tobacco. She reports that she does not drink alcohol and does not use drugs.  Review of Systems: Constitutional: negative for fever or malaise Ophthalmic: negative for photophobia, double vision or loss of vision Cardiovascular: negative for  chest pain, dyspnea on exertion, or new LE swelling Respiratory: negative for SOB or persistent cough Gastrointestinal: negative for abdominal pain, change in bowel habits or melena Genitourinary: negative for dysuria or gross hematuria, no abnormal uterine bleeding or disharge Musculoskeletal: negative for new gait disturbance or muscular weakness Integumentary: negative for new or persistent rashes, no breast lumps Neurological: negative for TIA or stroke symptoms Psychiatric: negative for SI or delusions Allergic/Immunologic: negative for hives  Patient Care Team    Relationship Specialty Notifications Start End  Willow Ora, MD PCP - General Family Medicine  02/14/17   Ginette Otto, [facility] Consult[facility] Physician Gynecology  02/14/17    Comment: Dr. Darleen Crocker, Kristine Garbe, MD  Consulting Physician Gastroenterology  04/05/19     Objective  Vitals: BP 128/80   Pulse 67   Temp 98.7 F (37.1 C) (Temporal)   Ht 5\' 7"  (1.702 m)   Wt 174 lb (78.9 kg)   SpO2 98%   BMI 27.25 kg/m  General:  Well developed, well nourished, no acute distress  Psych:  Alert and orientedx3,normal mood and affect HEENT:  Normocephalic, atraumatic, non-icteric sclera,  supple neck without adenopathy, mass or thyromegaly Cardiovascular:  Normal S1, S2, RRR without gallop, rub or murmur Respiratory:  Good breath sounds bilaterally, CTAB with normal respiratory effort Gastrointestinal: normal bowel sounds, soft, non-tender, no noted masses. No HSM MSK: no deformities, contusions. Joints are without erythema or swelling.  Skin:  Warm, no rashes or suspicious lesions noted    Commons side effects, risks, benefits, and alternatives for medications and treatment plan prescribed today were discussed, and the patient expressed understanding of the given instructions. Patient is instructed to call or message via MyChart if he/she has any questions or concerns regarding our treatment plan. No barriers to understanding were identified. We discussed Red Flag symptoms and signs in detail. Patient expressed understanding regarding what to do in case of urgent or emergency type symptoms.   Medication list was reconciled, printed and provided to the patient in AVS. Patient instructions and summary information was reviewed with the patient as documented in the AVS. This note was prepared with assistance of Dragon voice recognition software. Occasional wrong-word or sound-a-like substitutions may have occurred due to the inherent limitations of voice recognition software  This visit occurred during the SARS-CoV-2 public health emergency.  Safety protocols were in place, including screening questions prior to the visit, additional usage of staff PPE, and extensive cleaning of exam room while observing appropriate  contact time as indicated for disinfecting solutions.

## 2020-07-28 NOTE — Patient Instructions (Signed)
Please return in 12 months for your annual complete physical; please come fasting.  I will release your lab results to you on your MyChart account with further instructions. Please reply with any questions.   Today you were given your 2nd of 2 Shingrix and pneumovax vaccinations.   If you have any questions or concerns, please don't hesitate to send me a message via MyChart or call the office at [phone]. Thank you for visiting with [place] today! It's our pleasure caring for you.   Bunion A bunion (hallux valgus) is a bump that forms slowly on the inner side of the big toe joint. It occurs when the big toe turns toward the second toe. Bunions may be small at first, but they often get larger over time. They can make walking painful. What are the causes? This condition may be caused by:  Wearing narrow or pointed shoes that force the big toe to press against the other toes.  Abnormal foot development that causes the foot to roll inward.  Changes in the foot that are caused by certain diseases, such as rheumatoid arthritis or polio.  A foot injury. What increases the risk? The following factors may make you more likely to develop this condition:  Wearing shoes that squeeze the toes together.  Having certain diseases, such as: ? Rheumatoid arthritis. ? Polio. ? Cerebral palsy.  Having family members who have bunions.  Being born with abnormally shaped feet (a foot deformity), such as flat feet or low arches.  Doing activities that put a lot of pressure on the feet, such as ballet dancing. What are the signs or symptoms? The main symptom of this condition is a bump on your big toe that you can notice. Other symptoms may include:  Pain.  Redness and inflammation around your big toe.  Thick or hardened skin on your big toe or between your toes.  Stiffness or loss of motion in your big toe.  Trouble with walking.   How is this diagnosed? This condition may be diagnosed based  on your symptoms, medical history, and activities. You may also have tests and imaging, such as:  X-rays. These allow your health care provider to check the position of the bones in your foot and look for damage to your joint. They also help your health care provider determine the severity of your bunion and the best way to treat it.  Joint aspiration. In this test, a sample of fluid is removed from the toe joint. This test may be done if you are in a lot of pain. It helps rule out diseases that cause painful swelling of the joints, such as arthritis or gout. How is this treated? Treatment depends on the severity of your symptoms. The goal of treatment is to relieve symptoms and prevent your bunion from getting worse. Your health care provider may recommend:  Wearing shoes that have a wide toe box, or using bunion pads to cushion the affected area.  Taping your toes together to keep them in a normal position.  Placing a device inside your shoe (orthotic device) to help reduce pressure on your toe joint.  Taking medicine to ease pain and inflammation.  Putting ice or heat on the affected area.  Doing stretching exercises.  Surgery, for severe cases. Follow these instructions at home: Managing pain, stiffness, and swelling  If directed, put ice on the painful area. To do this: ? Put ice in a plastic bag. ? Place a towel between your skin  and the bag. ? Leave the ice on for 20 minutes, 2-3 times a day. ? Remove the ice if your skin turns bright red. This is very important. If you cannot feel pain, heat, or cold, you have a greater risk of damage to the area.  If directed, apply heat to the affected area before you exercise. Use the heat source that your health care provider recommends, such as a moist heat pack or a heating pad. ? Place a towel between your skin and the heat source. ? Leave the heat on for 20-30 minutes. ? Remove the heat if your skin turns bright red. This is  especially important if you are unable to feel pain, heat, or cold. You have a greater risk of getting burned.      General instructions  Do exercises as told by your health care provider.  Support your toe joint with proper footwear, shoe padding, or taping as told by your health care provider.  Take over-the-counter and prescription medicines only as told by your health care provider.  Do not use any products that contain nicotine or tobacco, such as cigarettes, e-cigarettes, and chewing tobacco. If you need help quitting, ask your health care provider.  Keep all follow-up visits. This is important. Contact a health care provider if:  Your symptoms get worse.  Your symptoms do not improve in 2 weeks. Get help right away if:  You have severe pain and trouble with walking. Summary  A bunion is a bump on the inner side of the big toe joint that forms when the big toe turns toward the second toe.  Bunions can make walking painful.  Treatment depends on the severity of your symptoms.  Support your toe joint with proper footwear, shoe padding, or taping as told by your health care provider. This information is not intended to replace advice given to you by your health care provider. Make sure you discuss any questions you have with your health care provider. Document Revised: 06/25/2019 Document Reviewed: 06/25/2019 Elsevier Patient Education  2021 [employer].

## 2020-08-02 ENCOUNTER — Encounter: Payer: Self-pay | Admitting: Family Medicine

## 2020-08-02 DIAGNOSIS — G720 Drug-induced myopathy: Secondary | ICD-10-CM | POA: Insufficient documentation

## 2020-08-02 DIAGNOSIS — T466X5A Adverse effect of antihyperlipidemic and antiarteriosclerotic drugs, initial encounter: Secondary | ICD-10-CM | POA: Insufficient documentation

## 2020-10-16 ENCOUNTER — Telehealth: Payer: Self-pay

## 2020-10-16 NOTE — Telephone Encounter (Signed)
Patient states that she was recommended to take another Statin for the blood pressure, and is willing to try another one out.

## 2020-10-16 NOTE — Telephone Encounter (Signed)
Please advise 

## 2020-10-23 MED ORDER — ROSUVASTATIN CALCIUM 10 MG PO TABS: 10.0000 mg | ORAL_TABLET | Freq: Every day | ORAL | 3 refills | Status: DC

## 2020-10-23 NOTE — Telephone Encounter (Signed)
Please call patient: I've ordered crestor 10mg ; please ask her to start 3x/ week to see if she tolerates it; after several weeks, if it is ok, can increase to daily.   Thanks.

## 2020-10-23 NOTE — Addendum Note (Signed)
Addended by: Asencion Partridge on: 10/23/2020 10:02 AM   Modules accepted: Orders

## 2020-10-24 ENCOUNTER — Other Ambulatory Visit: Payer: Self-pay

## 2020-10-24 MED ORDER — LOVASTATIN 20 MG PO TABS: 20.0000 mg | ORAL_TABLET | Freq: Every day | ORAL | 0 refills | Status: DC

## 2020-10-24 NOTE — Telephone Encounter (Signed)
Script sent to pharmacy.

## 2020-10-24 NOTE — Telephone Encounter (Signed)
Patient's brother also has side effects from statins but states he does well on Lovastatin. Patient is wanting to try Lovastatin first before Crestor

## 2020-10-31 ENCOUNTER — Encounter: Payer: Self-pay | Admitting: Family Medicine

## 2020-10-31 ENCOUNTER — Telehealth (INDEPENDENT_AMBULATORY_CARE_PROVIDER_SITE_OTHER): Payer: Medicare Other | Admitting: Family Medicine

## 2020-10-31 VITALS — Ht 67.0 in

## 2020-10-31 DIAGNOSIS — T466X5A Adverse effect of antihyperlipidemic and antiarteriosclerotic drugs, initial encounter: Secondary | ICD-10-CM | POA: Diagnosis not present

## 2020-10-31 DIAGNOSIS — G720 Drug-induced myopathy: Secondary | ICD-10-CM

## 2020-10-31 DIAGNOSIS — E785 Hyperlipidemia, unspecified: Secondary | ICD-10-CM | POA: Diagnosis not present

## 2020-10-31 NOTE — Progress Notes (Signed)
   Ashley Duffy is a 65 y.o. female who presents today for a virtual office visit.  Assessment/Plan:  Chronic Problems Addressed Today: Dyslipidemia / Statin Myopathy Had lengthy discussion with patient regarding her statin intolerance and dyslipidemia.  We will switch her lovastatin to intermittent dosing twice weekly.  Also discussed co-Q10 and that there is some limited evidence that a could potentially help some individuals prevent side effects of statins.  She will start this as well.  She will check with [place] in several weeks via MyChart.  We can titrate dose of statin as tolerated to maximal tolerated dose.  If continues to have issues with statin even at intermittent dosing or if she is not meeting target cholesterol goals would consider referral to lipid clinic.     Subjective:  HPI:  See A/P for status of chronic conditions.  She was found to have elevated LDL on last lipid panel.  She would like to start a cholesterol medication.  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  Was originally prescribed Crestor but was concerned about side effects with this.  She was started on lovastatin about a week ago.  She did this for a few days but then started develop muscle aches, weakness, and some blurred vision.  She would like to stay on a statin if at all possible but is interested in the ways to help mitigate side effects.       Objective/Observations  Physical Exam: Gen: NAD, resting comfortably Pulm: Normal work of breathing Neuro: Grossly normal, moves all extremities Psych: Normal affect and thought content  Virtual Visit via Video   I connected with Neomia Dear on 10/31/20 at  9:40 AM EDT by a video enabled telemedicine application and verified that I am speaking with the correct person using two identifiers. The limitations of evaluation and management by telemedicine and the availability of in person appointments were discussed. The patient expressed understanding  and agreed to proceed.   Patient location: Home Provider location: [place] [facility] [employer] Persons participating in the virtual visit: Myself and Patient     Katina Degree. Jimmey Ralph, MD 10/31/2020 9:33 AM

## 2020-12-25 ENCOUNTER — Other Ambulatory Visit: Payer: Self-pay

## 2020-12-25 ENCOUNTER — Encounter: Payer: Self-pay | Admitting: Family Medicine

## 2020-12-25 DIAGNOSIS — E785 Hyperlipidemia, unspecified: Secondary | ICD-10-CM

## 2020-12-25 MED ORDER — ROSUVASTATIN CALCIUM 5 MG PO TABS: 5.0000 mg | ORAL_TABLET | Freq: Every day | ORAL | 3 refills | Status: DC

## 2020-12-25 NOTE — Telephone Encounter (Signed)
Spoke with patient states she has been taking Co-q10 daily, will try the crestor and let us know how it works.

## 2021-05-04 ENCOUNTER — Telehealth: Payer: Self-pay | Admitting: Family Medicine

## 2021-05-04 DIAGNOSIS — A059 Bacterial foodborne intoxication, unspecified: Secondary | ICD-10-CM | POA: Diagnosis not present

## 2021-05-04 NOTE — Telephone Encounter (Signed)
Spoke with patient stated still with upset stomach, gas, brain fog eye burning and headache  ?No temperature. Stated will take a nap then go to UC ?Advise to go to UC  ?Patient was send to triage.  ?

## 2021-05-04 NOTE — Telephone Encounter (Signed)
Patient Name: Ashley Duffy Gender: Female DOB: 17-Nov-1955 Age: 66 Y Return Phone Number: [phone] (Primary) Address: City/ State/ Zip: [place]  [address] Client [facility] - Administrator, [facility] at [facility] Provider Asencion Partridge- MD Contact Type Call Who Is Calling Patient / Member / Family / Caregiver Call Type Triage / Clinical Relationship To Patient Self Return Phone Number [phone] (Primary) Chief Complaint CONFUSION - new onset Reason for Call Symptomatic / Request for Health Information Initial Comment Caller states, pt has food poisoning, stomach upset, brain fog, eye burning, and headache. Translation No Nurse Assessment Nurse: Breeding, RN, [place] Date/Time (Eastern Time): 05/04/2021 11:32:04 AM Confirm and document reason for call. If symptomatic, describe symptoms. ---Caller states pt has food poisoning w/ listeria per the company, stomach upset but has improved, brain fog, eye burning, and occ. headache. Food was eaten on Sunday night and Wednesday, it was shrimp. Caller denies any other s/s at this time. Does the patient have any new or worsening symptoms? ---Yes Will a triage be completed? ---Yes Related visit to physician within the last 2 weeks? ---No Does the PT have any chronic conditions? (i.e. diabetes, asthma, this includes High risk factors for pregnancy, etc.) ---Yes List chronic conditions. ---htn Is this a behavioral health or substance abuse call? ---No Guidelines Guideline Title Affirmed Question Affirmed Notes Nurse Date/Time (Eastern Time) Confusion - Delirium Headache or vomiting Breeding, RN, [place] 05/04/2021 11:50:12 AM Disp. Time Lamount Cohen Time) Disposition Final User 05/04/2021 11:29:44 AM Send to Urgent Queue Terrilee Files   05/04/2021 11:57:58 AM Go to ED Now Yes Breeding, RN, [place] Caller Disagree/Comply Disagree Caller Understands  Yes PreDisposition Go to Urgent Care/Walk-In Clinic Care Advice Given Per Guideline GO TO ED NOW: * You need to be seen in the Emergency Department. * Go to the ED at ___________ Hospital. * Leave now. Drive carefully. NOTE TO TRIAGER - DRIVING: * Another adult should drive. * Patient should not delay going to the emergency department. CARE ADVICE given per Confusion-Delirium (Adult) guideline. Comments User: Vickie Epley, RN Date/Time (Eastern Time): 05/04/2021 11:37:58 AM Caller notes eyes are burning, itching, and puffiness noted. User: Macon Large, Sondra Barges, RN Date/Time (Eastern Time): 05/04/2021 11:57:38 AM Caller notes brain fog and confusion, she thinks the head ache is due to the eye issues but unsure. Advised caller that listeria can cause HA and confusion per WebMD. Caller verbalizes understanding of disposition but is going to wait and see if she can find someone to take her to UC later and if it gets worse then she will go to ED d/t fear of covid exposure if ED waiting rooms. Referrals GO TO FACILITY UNDECIDED

## 2021-05-04 NOTE — Telephone Encounter (Signed)
Patient consumed shrimp with Listeria monocytogenes and has been sick for the last 4 days. She would like an anti biotic sent to her pharmacy because she is having brain fog,headache, and eye swelling. She refused to come in and is going to urgent care.  ?

## 2021-05-07 ENCOUNTER — Other Ambulatory Visit: Payer: Self-pay

## 2021-05-07 ENCOUNTER — Encounter (HOSPITAL_BASED_OUTPATIENT_CLINIC_OR_DEPARTMENT_OTHER): Payer: Self-pay | Admitting: *Deleted

## 2021-05-07 ENCOUNTER — Emergency Department (HOSPITAL_BASED_OUTPATIENT_CLINIC_OR_DEPARTMENT_OTHER): Payer: Medicare Other

## 2021-05-07 ENCOUNTER — Inpatient Hospital Stay (HOSPITAL_BASED_OUTPATIENT_CLINIC_OR_DEPARTMENT_OTHER)
Admission: EM | Admit: 2021-05-07 | Discharge: 2021-05-09 | DRG: 869 | Disposition: A | Discharge status: Home / Self Care | Payer: Medicare Other | Attending: Internal Medicine | Admitting: Internal Medicine

## 2021-05-07 DIAGNOSIS — Z79899 Other long term (current) drug therapy: Secondary | ICD-10-CM

## 2021-05-07 DIAGNOSIS — Z20822 Contact with and (suspected) exposure to covid-19: Secondary | ICD-10-CM | POA: Diagnosis present

## 2021-05-07 DIAGNOSIS — R519 Headache, unspecified: Secondary | ICD-10-CM | POA: Diagnosis present

## 2021-05-07 DIAGNOSIS — F419 Anxiety disorder, unspecified: Secondary | ICD-10-CM | POA: Diagnosis present

## 2021-05-07 DIAGNOSIS — G44209 Tension-type headache, unspecified, not intractable: Secondary | ICD-10-CM | POA: Diagnosis not present

## 2021-05-07 DIAGNOSIS — F4322 Adjustment disorder with anxiety: Secondary | ICD-10-CM | POA: Diagnosis not present

## 2021-05-07 DIAGNOSIS — Z820 Family history of epilepsy and other diseases of the nervous system: Secondary | ICD-10-CM | POA: Diagnosis not present

## 2021-05-07 DIAGNOSIS — A3211 Listerial meningitis: Secondary | ICD-10-CM | POA: Diagnosis not present

## 2021-05-07 DIAGNOSIS — I10 Essential (primary) hypertension: Secondary | ICD-10-CM | POA: Diagnosis not present

## 2021-05-07 DIAGNOSIS — Z87891 Personal history of nicotine dependence: Secondary | ICD-10-CM

## 2021-05-07 DIAGNOSIS — Z880 Allergy status to penicillin: Secondary | ICD-10-CM

## 2021-05-07 DIAGNOSIS — E7800 Pure hypercholesterolemia, unspecified: Secondary | ICD-10-CM | POA: Diagnosis present

## 2021-05-07 DIAGNOSIS — Z800 Family history of malignant neoplasm of digestive organs: Secondary | ICD-10-CM

## 2021-05-07 DIAGNOSIS — Z811 Family history of alcohol abuse and dependence: Secondary | ICD-10-CM

## 2021-05-07 DIAGNOSIS — Z888 Allergy status to other drugs, medicaments and biological substances status: Secondary | ICD-10-CM | POA: Diagnosis not present

## 2021-05-07 DIAGNOSIS — E119 Type 2 diabetes mellitus without complications: Secondary | ICD-10-CM | POA: Diagnosis present

## 2021-05-07 DIAGNOSIS — Z8261 Family history of arthritis: Secondary | ICD-10-CM | POA: Diagnosis not present

## 2021-05-07 DIAGNOSIS — G4700 Insomnia, unspecified: Secondary | ICD-10-CM | POA: Diagnosis present

## 2021-05-07 DIAGNOSIS — Z8249 Family history of ischemic heart disease and other diseases of the circulatory system: Secondary | ICD-10-CM | POA: Diagnosis not present

## 2021-05-07 DIAGNOSIS — Z8349 Family history of other endocrine, nutritional and metabolic diseases: Secondary | ICD-10-CM

## 2021-05-07 DIAGNOSIS — Z803 Family history of malignant neoplasm of breast: Secondary | ICD-10-CM

## 2021-05-07 HISTORY — DX: Listerial meningitis: A32.11

## 2021-05-07 LAB — COMPREHENSIVE METABOLIC PANEL
ALT: 65 U/L — ABNORMAL HIGH (ref 0–44)
AST: 36 U/L (ref 15–41)
Albumin: 4.1 g/dL (ref 3.5–5.0)
Alkaline Phosphatase: 95 U/L (ref 38–126)
Anion gap: 10 (ref 5–15)
BUN: 14 mg/dL (ref 8–23)
CO2: 24 mmol/L (ref 22–32)
Calcium: 9.8 mg/dL (ref 8.9–10.3)
Chloride: 104 mmol/L (ref 98–111)
Creatinine, Ser: 0.66 mg/dL (ref 0.44–1.00)
GFR, Estimated: >60 mL/min (ref >60)
Glucose, Bld: 95 mg/dL (ref 70–99)
Potassium: 3.5 mmol/L (ref 3.5–5.1)
Sodium: 138 mmol/L (ref 135–145)
Total Bilirubin: 0.7 mg/dL (ref 0.3–1.2)
Total Protein: 6.7 g/dL (ref 6.5–8.1)

## 2021-05-07 LAB — CBC WITH DIFFERENTIAL/PLATELET
Abs Immature Granulocytes: 0.04 10*3/uL (ref 0.00–0.07)
Basophils Absolute: 0 10*3/uL (ref 0.0–0.1)
Basophils Relative: 0 %
Eosinophils Absolute: 0.1 10*3/uL (ref 0.0–0.5)
Eosinophils Relative: 1 %
HCT: 39.7 % (ref 36.0–46.0)
Hemoglobin: 13.3 g/dL (ref 12.0–15.0)
Immature Granulocytes: 1 %
Lymphocytes Relative: 27 %
Lymphs Abs: 2.2 10*3/uL (ref 0.7–4.0)
MCH: 29.6 pg (ref 26.0–34.0)
MCHC: 33.5 g/dL (ref 30.0–36.0)
MCV: 88.4 fL (ref 80.0–100.0)
Monocytes Absolute: 0.7 10*3/uL (ref 0.1–1.0)
Monocytes Relative: 8 %
Neutro Abs: 5.1 10*3/uL (ref 1.7–7.7)
Neutrophils Relative %: 63 %
Platelets: 341 10*3/uL (ref 150–400)
RBC: 4.49 MIL/uL (ref 3.87–5.11)
RDW: 14.1 % (ref 11.5–15.5)
WBC: 8.1 10*3/uL (ref 4.0–10.5)
nRBC: 0 % (ref 0.0–0.2)

## 2021-05-07 LAB — CSF CELL COUNT WITH DIFFERENTIAL
RBC Count, CSF: 1 /mm3 — ABNORMAL HIGH
RBC Count, CSF: 1 /mm3 — ABNORMAL HIGH
Tube #: 2
Tube #: 4
WBC, CSF: 1 /mm3 (ref 0–5)
WBC, CSF: 2 /mm3 (ref 0–5)

## 2021-05-07 LAB — RESP PANEL BY RT-PCR (FLU A&B, COVID) ARPGX2
Influenza A by PCR: NEGATIVE
Influenza B by PCR: NEGATIVE
SARS Coronavirus 2 by RT PCR: NEGATIVE

## 2021-05-07 LAB — PROTEIN AND GLUCOSE, CSF
Glucose, CSF: 50 mg/dL (ref 40–70)
Total  Protein, CSF: 64 mg/dL — ABNORMAL HIGH (ref 15–45)

## 2021-05-07 MED ORDER — SODIUM CHLORIDE 0.9 % IV SOLN
2.0000 g | Freq: Four times a day (QID) | INTRAVENOUS | Status: DC
  Filled 2021-05-07 (×3): qty 2000

## 2021-05-07 MED ORDER — ENOXAPARIN SODIUM 40 MG/0.4ML IJ SOSY
40.0000 mg | PREFILLED_SYRINGE | INTRAMUSCULAR | Status: DC
  Administered 2021-05-07 – 2021-05-08 (×2): 40 mg via SUBCUTANEOUS
  Filled 2021-05-07 (×2): qty 0.4

## 2021-05-07 MED ORDER — PROSIGHT PO TABS
1.0000 | ORAL_TABLET | ORAL | Status: DC
  Administered 2021-05-08: 1 via ORAL
  Filled 2021-05-07: qty 1

## 2021-05-07 MED ORDER — ALPRAZOLAM 0.25 MG PO TABS
0.2500 mg | ORAL_TABLET | Freq: Every evening | ORAL | Status: DC | PRN
  Administered 2021-05-07: 0.25 mg via ORAL
  Filled 2021-05-07: qty 1

## 2021-05-07 MED ORDER — SODIUM CHLORIDE 0.9 % IV SOLN: INTRAVENOUS | Status: DC

## 2021-05-07 MED ORDER — ALPRAZOLAM 0.25 MG PO TABS: 0.2500 mg | ORAL_TABLET | Freq: Every day | ORAL | Status: DC | PRN

## 2021-05-07 MED ORDER — SODIUM CHLORIDE 0.9 % IV SOLN
2.0000 g | INTRAVENOUS | Status: DC
  Administered 2021-05-07 – 2021-05-09 (×9): 2 g via INTRAVENOUS
  Filled 2021-05-07 (×8): qty 2000
  Filled 2021-05-07 (×2): qty 2
  Filled 2021-05-07 (×3): qty 2000

## 2021-05-07 MED ORDER — KETOROLAC TROMETHAMINE 15 MG/ML IJ SOLN: 15.0000 mg | Freq: Four times a day (QID) | INTRAMUSCULAR | Status: DC | PRN

## 2021-05-07 MED ORDER — SODIUM CHLORIDE 0.9 % IV SOLN
2.0000 g | Freq: Once | INTRAVENOUS | Status: AC
  Administered 2021-05-07: 2 g via INTRAVENOUS

## 2021-05-07 MED ORDER — LORAZEPAM 1 MG PO TABS
1.0000 mg | ORAL_TABLET | Freq: Once | ORAL | Status: AC
  Administered 2021-05-07: 1 mg via ORAL
  Filled 2021-05-07: qty 1

## 2021-05-07 MED ORDER — AMLODIPINE BESYLATE 10 MG PO TABS
10.0000 mg | ORAL_TABLET | Freq: Every day | ORAL | Status: DC
  Administered 2021-05-08 – 2021-05-09 (×2): 10 mg via ORAL
  Filled 2021-05-07 (×2): qty 1

## 2021-05-07 MED ORDER — SODIUM CHLORIDE 0.9% FLUSH
3.0000 mL | Freq: Two times a day (BID) | INTRAVENOUS | Status: DC
  Administered 2021-05-08 – 2021-05-09 (×3): 3 mL via INTRAVENOUS

## 2021-05-07 NOTE — ED Provider Notes (Signed)
.  Lumbar Puncture ? ?Date/Time: 05/07/2021 4:20 PM ?Performed by: Lucrezia Starch, MD ?Authorized by: Lucrezia Starch, MD  ? ?Consent:  ?  Consent obtained:  Written ?  Consent given by:  Patient ?  Risks, benefits, and alternatives were discussed: yes   ?  Risks discussed:  Bleeding, headache, pain, repeat procedure, nerve damage and infection ?  Alternatives discussed:  No treatment, delayed treatment, alternative treatment, observation and referral ?Universal protocol:  ?  Site/side marked: yes   ?  Patient identity confirmed:  Verbally with patient ?Pre-procedure details:  ?  Procedure purpose:  Diagnostic ?Procedure details:  ?  Lumbar space:  L4-L5 interspace ?  Patient position:  Sitting ?  Needle gauge:  20 ?  Needle length (in):  2.5 ?  Ultrasound guidance: no   ?  Number of attempts:  1 ?  Fluid appearance:  Clear ?  Tubes of fluid:  4 ?  Total volume (ml):  6 ?Post-procedure details:  ?  Puncture site:  Adhesive bandage applied ?  Procedure completion:  Tolerated well, no immediate complications ? ?  ?Lucrezia Starch, MD ?05/07/21 1621 ? ?

## 2021-05-07 NOTE — ED Notes (Signed)
Pt tolerated LP very well

## 2021-05-07 NOTE — ED Notes (Signed)
BC x 2 obtained prior to ABX administration 

## 2021-05-07 NOTE — ED Provider Notes (Addendum)
[facility] EMERGENCY DEPARTMENT Provider Note   CSN: [id number] Arrival date & time: 05/07/21  0906     History  Chief Complaint  Patient presents with   Headache    Ashley Duffy is a 66 y.o. female with medical history significant for anxiety, diverticulitis, hyperlipidemia, hypertension, depression.  Patient presents to ED for evaluation of suspected Listeria poisoning.  Patient reports that on Sunday of last week she purchased shrimp from local supermarket chain [employer].  Patient reports that she began experiencing photophobia, headaches and generalized fatigue on Tuesday of that week.  Patient continues that she tried to take Tylenol and alleviate symptoms which seems to alleviate headache momentarily but her headache always returns.  Patient states that she received a notification from [employer] on Thursday explaining that the lot number/packaging of the shrimp she purchased was noted to be contaminated with Listeria.  Patient states that she presented to ED for evaluation/blood culturing.  Patient denies nausea, vomiting, diarrhea, fevers.   Headache Associated symptoms: fatigue and photophobia   Associated symptoms: no diarrhea, no fever, no nausea and no vomiting       Home Medications Prior to Admission medications   Medication Sig Start Date End Date Taking? Authorizing Provider  amLODipine (NORVASC) 10 MG tablet Take 1 tablet (10 mg total) by mouth daily. 07/28/20  Yes Willow Ora, MD  ALPRAZolam Prudy Feeler) 0.25 MG tablet Take 1 tablet (0.25 mg total) by mouth daily as needed. 07/28/20   Willow Ora, MD  lovastatin (MEVACOR) 20 MG tablet Take 1 tablet (20 mg total) by mouth at bedtime. Patient not taking: Reported on 10/31/2020 10/24/20   Willow Ora, MD  rosuvastatin (CRESTOR) 5 MG tablet Take 1 tablet (5 mg total) by mouth daily. Take 1 tablet (5 mg total) by mouth twice a week 12/25/20   Willow Ora, MD  VITAMIN D PO Take by mouth.    [provider]      Allergies    Penicillin g and Atorvastatin    Review of Systems   Review of Systems  Constitutional:  Positive for fatigue. Negative for chills and fever.  Eyes:  Positive for photophobia.  Gastrointestinal:  Negative for diarrhea, nausea and vomiting.  Neurological:  Positive for headaches.  All other systems reviewed and are negative.  Physical Exam Updated Vital Signs BP 123/75    Pulse (!) 56    Temp 97.8 F (36.6 C) (Oral)    Resp (!) 24    Ht 5\' 5"  (1.651 m)    Wt 77.1 kg    SpO2 99%    BMI 28.29 kg/m  Physical Exam Vitals and nursing note reviewed.  Constitutional:      General: She is not in acute distress.    Appearance: She is not ill-appearing, toxic-appearing or diaphoretic.  HENT:     Head: Normocephalic and atraumatic.     Nose: Nose normal.     Mouth/Throat:     Mouth: Mucous membranes are moist.  Eyes:     General: No visual field deficit.    Extraocular Movements: Extraocular movements intact.     Pupils: Pupils are equal, round, and reactive to light.  Neck:     Meningeal: Brudzinski's sign and Kernig's sign absent.  Cardiovascular:     Rate and Rhythm: Normal rate and regular rhythm.  Pulmonary:     Effort: Pulmonary effort is normal.     Breath sounds: Normal breath sounds. No wheezing.  Abdominal:  General: Bowel sounds are normal. There is no distension.     Palpations: Abdomen is soft.     Tenderness: There is no abdominal tenderness.  Musculoskeletal:        General: Normal range of motion.     Cervical back: Normal range of motion and neck supple. No rigidity.  Skin:    General: Skin is warm and dry.     Capillary Refill: Capillary refill takes less than 2 seconds.  Neurological:     General: No focal deficit present.     Mental Status: She is alert and oriented to person, place, and time.     GCS: GCS eye subscore is 4. GCS verbal subscore is 5. GCS motor subscore is 6.     Cranial Nerves: Cranial nerves 2-12 are intact. No  cranial nerve deficit or dysarthria.     Sensory: Sensation is intact. No sensory deficit.     Motor: Motor function is intact. No weakness.     Coordination: Coordination is intact. Heel to [facility] Test normal.    ED Results / Procedures / Treatments   Labs (all labs ordered are listed, but only abnormal results are displayed) Labs Reviewed  COMPREHENSIVE METABOLIC PANEL - Abnormal; Notable for the following components:      Result Value   ALT 65 (*)    All other components within normal limits  PROTEIN AND GLUCOSE, CSF - Abnormal; Notable for the following components:   Total  Protein, CSF 64 (*)    All other components within normal limits  RESP PANEL BY RT-PCR (FLU A&B, COVID) ARPGX2  CULTURE, BLOOD (ROUTINE X 2)  CULTURE, BLOOD (ROUTINE X 2)  CSF CULTURE W GRAM STAIN  CBC WITH DIFFERENTIAL/PLATELET  CSF CELL COUNT WITH DIFFERENTIAL  CSF CELL COUNT WITH DIFFERENTIAL    EKG None  Radiology CT Head Wo Contrast  Result Date: 05/07/2021 CLINICAL DATA:  Sudden onset headache and photophobia. EXAM: CT HEAD WITHOUT CONTRAST TECHNIQUE: Contiguous axial images were obtained from the base of the skull through the vertex without intravenous contrast. RADIATION DOSE REDUCTION: This exam was performed according to the departmental dose-optimization program which includes automated exposure control, adjustment of the mA and/or kV according to patient size and/or use of iterative reconstruction technique. COMPARISON:  None. FINDINGS: Brain: No evidence of acute infarction, hemorrhage, hydrocephalus, extra-axial collection or mass lesion/mass effect. Vascular: Atherosclerotic vascular calcification of the carotid siphons. No hyperdense vessel. Skull: Normal. Negative for fracture or focal lesion. Sinuses/Orbits: No acute finding. Other: None. IMPRESSION: 1. No acute intracranial abnormality. Electronically Signed   By: Obie Dredge M.D.   On: 05/07/2021 12:54    Procedures .Lumbar  Puncture  Date/Time: 05/07/2021 4:02 PM Performed by: Milagros Loll, MD Authorized by: Milagros Loll, MD   Consent:    Consent obtained:  Verbal   Consent given by:  Patient   Risks, benefits, and alternatives were discussed: yes     Risks discussed:  Infection, nerve damage, bleeding, headache and pain   Alternatives discussed:  No treatment, delayed treatment and alternative treatment Universal protocol:    Procedure explained and questions answered to patient or proxy's satisfaction: yes     Relevant documents present and verified: yes     Test results available: yes     Imaging studies available: yes     Required blood products, implants, devices, and special equipment available: yes     Immediately prior to procedure a time out was called: yes  Site/side marked: yes     Patient identity confirmed:  Arm band and verbally with patient Pre-procedure details:    Procedure purpose:  Diagnostic   Preparation: Patient was prepped and draped in usual sterile fashion   Anesthesia:    Anesthesia method:  Local infiltration   Local anesthetic:  Lidocaine 2% WITH epi Procedure details:    Lumbar space:  L3-L4 interspace   Patient position:  Sitting   Needle gauge:  24   Ultrasound guidance: no     Number of attempts:  1   Fluid appearance:  Clear   Tubes of fluid:  4 Post-procedure details:    Puncture site:  Adhesive bandage applied   Procedure completion:  Tolerated well, no immediate complications .Critical Care Performed by: Al DecantGroce, Shirah Roseman F, PA-C Authorized by: Al DecantGroce, Nimah Uphoff F, PA-C   Critical care provider statement:    Critical care time (minutes):  75   Critical care time was exclusive of:  Separately billable procedures and treating other patients and teaching time   Critical care was necessary to treat or prevent imminent or life-threatening deterioration of the following conditions:  CNS failure or compromise   Critical care was time spent  personally by me on the following activities:  Blood draw for specimens, development of treatment plan with patient or surrogate, discussions with consultants, discussions with primary provider, evaluation of patient's response to treatment, examination of patient, interpretation of cardiac output measurements, obtaining history from patient or surrogate, vascular access procedures, review of old charts, re-evaluation of patient's condition, ordering and review of radiographic studies, pulse oximetry, ordering and review of laboratory studies and ordering and performing treatments and interventions   I assumed direction of critical care for this patient from another provider in my specialty: no     Care discussed with: admitting provider      Medications Ordered in ED Medications  ampicillin (OMNIPEN) 2 g in sodium chloride 0.9 % 100 mL IVPB (0 g Intravenous Stopped 05/07/21 1507)  LORazepam (ATIVAN) tablet 1 mg (1 mg Oral Given 05/07/21 1322)    ED Course/ Medical Decision Making/ A&P                           Medical Decision Making Amount and/or Complexity of Data Reviewed Labs: ordered. Radiology: ordered.  Risk Prescription drug management. Decision regarding hospitalization.   66 year old female presents ED for evaluation of suspected Listeria poisoning.  Please see HPI for further details.  On examination, patient is afebrile, nontachycardic, not hypoxic, clear lung sounds bilaterally, soft compressible abdomen.  Patient nontoxic in appearance.  Infectious disease was consulted and the patient case was discussed.  Infectious disease instructed this provider to perform an LP, begin the patient on ampicillin 2 g every 4 hours and admit the patient for antibiotics and observation.  Lumbar puncture performed by my attending Dr. Stevie Kernykstra.  Patient worked up utilizing following labs and imaging studies personally interpreted by me: - CMP unremarkable - Respiratory panel unremarkable -  CBC unremarkable - Patient given Ativan 1 mg due to anxiety, - Patient started on 2 g ampicillin  Patient case discussed with Dr. Lucianne MussKumar, [facility] hospitalist, who has agreed to admit the patient.  Patient stable at time admission.    Final Clinical Impression(s) / ED Diagnoses Final diagnoses:  Nonintractable headache, unspecified chronicity pattern, unspecified headache type    Rx / DC Orders ED Discharge Orders     None  Al Decant, PA-C 05/07/21 1622    Milagros Loll, MD 05/09/21 1150

## 2021-05-07 NOTE — H&P (Signed)
?History and Physical  ? ? ?Patient: Ashley Duffy [id number] DOB: 27-Oct-1955 ?DOA: 05/07/2021 ?DOS: the patient was seen and examined on 05/07/2021 ?PCP: Leamon Arnt, MD  ?Patient coming from: Home ? ?Chief Complaint:  ?Chief Complaint  ?Patient presents with  ? Headache  ? 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 ?Ate some contaminated shrimp from [employer] 2/26-did not know that it was contaminated-developed malaise-developed headache blurred vision double vision as well as brain fog eye burning and headache which culminated on 3/1--- without knowing what was going on again ate the rest of the trip on 3/3--Tylenol without relief had momentary headaches ?[place] with regards to these issues and was told to direct her self to the emergency room ?ID was consulted by the ED and they recommended 2 g of ampicillin every 4 hours and to admit for antibiotics as well as pain control ? ?Her photophobia phonophobia as well as "brain fog" which was worse this morning is now much better-she knows that she is at [place] long she knows place time person ?She is asking to get up ?She feels hungry and would like a diet ? ? ? ?Review of Systems: As mentioned in the history of present illness. All other systems reviewed and are negative. ?Past Medical History:  ?Diagnosis Date  ? Anxiety   ? Colon polyp   ? Depression   ? Diverticulitis   ? Diverticulosis   ? Hyperlipidemia   ? Hypertension   ? Panic attack as reaction to stress 11/27/2015  ? ?Past Surgical History:  ?Procedure Laterality Date  ? APPENDECTOMY    ? BREAST BIOPSY Left over 10 yrs ago  ? benign  ? COLONOSCOPY  2007  ? [place] SURGERY  1998  ? ?Social History:  reports that she has quit smoking. Her smoking use included cigarettes. She has never used smokeless tobacco. She reports current alcohol use. She reports that she does not use  drugs. ? ?Allergies  ?Allergen Reactions  ? Atorvastatin Other (See Comments)  ?  Severe myalgias  ? ? ?Family History  ?Problem Relation Age of Onset  ? Alzheimer's disease Mother   ? Breast cancer Mother 58  ? Hypertension Mother   ? Thyroid disease Mother   ? Alcohol abuse Father   ? Colon cancer Father   ? Arthritis Brother   ? Hypertension Brother   ? Breast cancer Maternal Aunt 63  ? Breast cancer Maternal Grandmother 14  ? Diabetes Neg Hx   ? ? ?Prior to Admission medications   ?Medication Sig Start Date End Date Taking? Authorizing Provider  ?amLODipine (NORVASC) 10 MG tablet Take 1 tablet (10 mg total) by mouth daily. 07/28/20  Yes Leamon Arnt, MD  ?Multiple Vitamin (MULTIVITAMIN ADULT PO) Take 1 tablet by mouth once a week.   Yes [provider]  ?Multiple Vitamins-Minerals (OCUVITE EXTRA) TABS Take 1 tablet by mouth once a week.   Yes [provider]  ?VITAMIN D PO Take 1 tablet by mouth daily.   Yes [provider]  ?ALPRAZolam (XANAX) 0.25 MG tablet Take 1 tablet (0.25 mg total) by mouth daily as needed. ?Patient not taking: Reported on 05/07/2021 07/28/20   Leamon Arnt, MD  ?lovastatin (MEVACOR) 20 MG tablet Take 1 tablet (20 mg total) by mouth at bedtime. ?Patient not taking: Reported on 10/31/2020 10/24/20   Leamon Arnt, MD  ?rosuvastatin ([place])  5 MG tablet Take 1 tablet (5 mg total) by mouth daily. Take 1 tablet (5 mg total) by mouth twice a week ?Patient not taking: Reported on 05/07/2021 12/25/20   Leamon Arnt, MD  ? ? ?Physical Exam: ?Vitals:  ? 05/07/21 1340 05/07/21 1507 05/07/21 1630 05/07/21 1731  ?BP: 134/82 123/75 (!) 114/98 (!) 151/94  ?Pulse: 66 (!) 56 62 61  ?Resp: 17 (!) 24 20 18   ?Temp:    98.8 ?F (37.1 ?C)  ?TempSrc:    Oral  ?SpO2: 100% 99% 100% 98%  ?Weight:      ?Height:      ? ?Awake coherent no distress EOMI NCAT no focal deficit CTA B no added sound rales rhonchi ?Abdomen soft no rebound guarding ROM intact ?S1-S2 no murmur no rub no  gallop ?No lower extremity edema ?ROM intact ?No icterus no pallor ?Throat soft supple no JVD no bruit Mallampati 2 ? ?Data Reviewed: ? ?I have reviewed the normal BmP as well as CBC results-LP results however are pending ?RBC count 1 on LP clear and colorless CSF ?Gram stain shows predominantly mononuclear WBCs no organisms on cytospin ?CSF protein is elevated at 64 glucose is 50 raising concern for ? ? ?Assessment and Plan: ?No notes have been filed under this hospital service. ?Service: Hospitalist ? 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 ? Advance Care Planning:   Code Status: Full Code  ? ?Consults: id ? ?Family Communication: no ? ?Severity of Illness: ?The appropriate patient status for this patient is INPATIENT. Inpatient status is judged to be reasonable and necessary in order to provide the required intensity of service to ensure the patient's safety. The patient's presenting symptoms, physical exam findings, and initial radiographic and laboratory data in the context of their chronic comorbidities is felt to place them at high risk for further clinical deterioration. Furthermore, it is not anticipated that the patient will be medically stable for discharge from the hospital within 2 midnights of admission.  ? ?* I certify that at the point of admission it is my clinical judgment that the patient will require inpatient hospital care spanning beyond 2 midnights from the point of admission due to high intensity of service, high risk for further deterioration and  high frequency of surveillance required.* ? ?Author: ?Nita Sells, MD ?05/07/2021 5:54 PM ? ?For on call review [email or web address].  ?

## 2021-05-07 NOTE — ED Notes (Signed)
Pt states she doesn't want to stay she has pets at home and will go tend to them and then[facility] to [facility] , Dr Cathleen Corti into speak to pt about ID wanting to have pt admitted , blood cultures and started on antibiotics, pt is thinking about all ?

## 2021-05-07 NOTE — ED Notes (Signed)
Now laying flat for 1 hour ?

## 2021-05-07 NOTE — ED Notes (Signed)
Went to start PCN and when ask pt if she was allergic and she  states yes , dr aware ?

## 2021-05-07 NOTE — ED Triage Notes (Signed)
She ate shrimp that made her sick. She states it tested positive for wisteria. She wants a blood culture. Pt has a headache. ?

## 2021-05-08 DIAGNOSIS — I10 Essential (primary) hypertension: Secondary | ICD-10-CM | POA: Diagnosis not present

## 2021-05-08 DIAGNOSIS — Z20822 Contact with and (suspected) exposure to covid-19: Secondary | ICD-10-CM | POA: Diagnosis not present

## 2021-05-08 DIAGNOSIS — Z811 Family history of alcohol abuse and dependence: Secondary | ICD-10-CM | POA: Diagnosis not present

## 2021-05-08 DIAGNOSIS — R519 Headache, unspecified: Secondary | ICD-10-CM | POA: Diagnosis not present

## 2021-05-08 DIAGNOSIS — Z8249 Family history of ischemic heart disease and other diseases of the circulatory system: Secondary | ICD-10-CM | POA: Diagnosis not present

## 2021-05-08 DIAGNOSIS — Z79899 Other long term (current) drug therapy: Secondary | ICD-10-CM | POA: Diagnosis not present

## 2021-05-08 DIAGNOSIS — A3211 Listerial meningitis: Secondary | ICD-10-CM | POA: Diagnosis not present

## 2021-05-08 DIAGNOSIS — Z8261 Family history of arthritis: Secondary | ICD-10-CM | POA: Diagnosis not present

## 2021-05-08 DIAGNOSIS — Z87891 Personal history of nicotine dependence: Secondary | ICD-10-CM | POA: Diagnosis not present

## 2021-05-08 DIAGNOSIS — Z880 Allergy status to penicillin: Secondary | ICD-10-CM | POA: Diagnosis not present

## 2021-05-08 DIAGNOSIS — Z803 Family history of malignant neoplasm of breast: Secondary | ICD-10-CM | POA: Diagnosis not present

## 2021-05-08 DIAGNOSIS — Z888 Allergy status to other drugs, medicaments and biological substances status: Secondary | ICD-10-CM | POA: Diagnosis not present

## 2021-05-08 DIAGNOSIS — E119 Type 2 diabetes mellitus without complications: Secondary | ICD-10-CM | POA: Diagnosis not present

## 2021-05-08 DIAGNOSIS — G44209 Tension-type headache, unspecified, not intractable: Secondary | ICD-10-CM | POA: Diagnosis not present

## 2021-05-08 DIAGNOSIS — E7800 Pure hypercholesterolemia, unspecified: Secondary | ICD-10-CM | POA: Diagnosis not present

## 2021-05-08 DIAGNOSIS — Z8349 Family history of other endocrine, nutritional and metabolic diseases: Secondary | ICD-10-CM | POA: Diagnosis not present

## 2021-05-08 DIAGNOSIS — Z820 Family history of epilepsy and other diseases of the nervous system: Secondary | ICD-10-CM | POA: Diagnosis not present

## 2021-05-08 DIAGNOSIS — Z800 Family history of malignant neoplasm of digestive organs: Secondary | ICD-10-CM | POA: Diagnosis not present

## 2021-05-08 LAB — COMPREHENSIVE METABOLIC PANEL
ALT: 48 U/L — ABNORMAL HIGH (ref 0–44)
AST: 24 U/L (ref 15–41)
Albumin: 3.7 g/dL (ref 3.5–5.0)
Alkaline Phosphatase: 82 U/L (ref 38–126)
Anion gap: 8 (ref 5–15)
BUN: 14 mg/dL (ref 8–23)
CO2: 23 mmol/L (ref 22–32)
Calcium: 9.4 mg/dL (ref 8.9–10.3)
Chloride: 108 mmol/L (ref 98–111)
Creatinine, Ser: 0.67 mg/dL (ref 0.44–1.00)
GFR, Estimated: >60 mL/min (ref >60)
Glucose, Bld: 92 mg/dL (ref 70–99)
Potassium: 4.1 mmol/L (ref 3.5–5.1)
Sodium: 139 mmol/L (ref 135–145)
Total Bilirubin: 0.4 mg/dL (ref 0.3–1.2)
Total Protein: 6.2 g/dL — ABNORMAL LOW (ref 6.5–8.1)

## 2021-05-08 LAB — CBC
HCT: 38.7 % (ref 36.0–46.0)
Hemoglobin: 12.7 g/dL (ref 12.0–15.0)
MCH: 30.1 pg (ref 26.0–34.0)
MCHC: 32.8 g/dL (ref 30.0–36.0)
MCV: 91.7 fL (ref 80.0–100.0)
Platelets: 310 10*3/uL (ref 150–400)
RBC: 4.22 MIL/uL (ref 3.87–5.11)
RDW: 14.3 % (ref 11.5–15.5)
WBC: 6.6 10*3/uL (ref 4.0–10.5)
nRBC: 0 % (ref 0.0–0.2)

## 2021-05-08 LAB — HIV ANTIBODY (ROUTINE TESTING W REFLEX): HIV Screen 4th Generation wRfx: NONREACTIVE

## 2021-05-08 LAB — PROTIME-INR
INR: 1 (ref 0.8–1.2)
Prothrombin Time: 13 seconds (ref 11.4–15.2)

## 2021-05-08 NOTE — Progress Notes (Signed)
?PROGRESS NOTE ? ? ?Ashley Duffy  YYT:[id number] DOB: December 08, 1955 DOA: 05/07/2021 ?PCP: Willow Ora, MD  ?Brief Narrative:  ? 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 ? ?Hospital-Problem based course ? ??  Bacterial/Listeria meningitis ?Continue ampicillin monotherapy and follow CSF culture to completion ?If negative I have discussed with Dr. Thedore Mins and we will discontinue completely all antibiotics ?DC IV fluid, patient may shower ?She probably can go home in the morning if cultures are negative at 48 hours ?If positive then we will need to recalibrate ?Hypertension ?Continue amlodipine 10 ?Hyperlipidemia NOS ? ? ?DVT prophylaxis: Lovenox ?Code Status: Full ?Family Communication: None ?Disposition:  ?Status is: Inpatient ?Remains inpatient appropriate because: Need further work-up ?  ?Consultants:  ?Infectious disease ? ?Procedures: Lumbar puncture on 3/5 ? ?Antimicrobials: Ampicillin ? ? ?Subjective: ?Awake alert coherent feels fair no further brain fog eating drinking ?Some SVT on monitors earlier today-not significant so we will just keep her on monitors today ? ?Objective: ?Vitals:  ? 05/07/21 2142 05/08/21 0059 05/08/21 0509 05/08/21 1340  ?BP: 128/63 136/72 106/71 136/69  ?Pulse: 68 (!) 58 67 66  ?Resp: 17 18 16 20   ?Temp: 98.2 ?F (36.8 ?C) 98.1 ?F (36.7 ?C) 98.2 ?F (36.8 ?C) (!) 97.5 ?F (36.4 ?C)  ?TempSrc: Oral Oral Oral Oral  ?SpO2: 97% 95% 96% 98%  ?Weight:      ?Height:      ? ? ?Intake/Output Summary (Last 24 hours) at 05/08/2021 1513 ?Last data filed at 05/08/2021 1511 ?Gross per 24 hour  ?Intake 2141.82 ml  ?Output --  ?Net 2141.82 ml  ? ?Filed Weights  ?  05/07/21 1035  ?Weight: 77.1 kg  ? ? ?Examination: ?EOMI NCAT no focal deficit ?CTA B no added sound rales rhonchi ?ROM intact moving all 4 limbs equally no Brudzinski sign no Kernigs sign ?S1-S2 no murmur no rub no gallop ?Power 5/5 ? ?Data Reviewed: personally reviewed  ? ?CBC ?   ?Component Value Date/Time  ? WBC 6.6 05/08/2021 0602  ? RBC 4.22 05/08/2021 0602  ? HGB 12.7 05/08/2021 0602  ? HCT 38.7 05/08/2021 0602  ? PLT 310 05/08/2021 0602  ? MCV 91.7 05/08/2021 0602  ? MCH 30.1 05/08/2021 0602  ? MCHC 32.8 05/08/2021 0602  ? RDW 14.3 05/08/2021 0602  ? LYMPHSABS 2.2 05/07/2021 1408  ? MONOABS 0.7 05/07/2021 1408  ? EOSABS 0.1 05/07/2021 1408  ? BASOSABS 0.0 05/07/2021 1408  ? ?CMP Latest Ref Rng & Units 05/08/2021 05/07/2021 07/28/2020  ?Glucose 70 - 99 mg/dL 92 95 91  ?BUN 8 - 23 mg/dL 14 14 9   ?Creatinine 0.44 - 1.00 mg/dL 07/30/2020 2.75  ?Sodium 135 - 145 mmol/L 139 138 140  ?Potassium 3.5 - 5.1 mmol/L 4.1 3.5 3.9  ?Chloride 98 - 111 mmol/L 108 104 105  ?CO2 22 - 32 mmol/L 23 24 26   ?Calcium 8.9 - 10.3 mg/dL 9.4 9.8 1.70  ?Total Protein 6.5 - 8.1 g/dL 6.2(L) 6.7 7.0  ?Total Bilirubin 0.3 - 1.2 mg/dL 0.4 0.7 0.5  ?Alkaline Phos 38 - 126 U/L 82 95 77  ?AST 15 - 41 U/L 24 36 20  ?ALT 0 - 44 U/L 48(H) 65(H) 24  ? ? ? ?  Radiology Studies: ?CT Head Wo Contrast ? ?Result Date: 05/07/2021 ?CLINICAL DATA:  Sudden onset headache and photophobia. EXAM: CT HEAD WITHOUT CONTRAST TECHNIQUE: Contiguous axial images were obtained from the base of the skull through the vertex without intravenous contrast. RADIATION DOSE REDUCTION: This exam was performed according to the departmental dose-optimization program which includes automated exposure control, adjustment of the mA and/or kV according to patient size and/or use of iterative reconstruction technique. COMPARISON:  None. FINDINGS: Brain: No evidence of acute infarction, hemorrhage, hydrocephalus, extra-axial collection or mass lesion/mass effect. Vascular: Atherosclerotic  vascular calcification of the carotid siphons. No hyperdense vessel. Skull: Normal. Negative for fracture or focal lesion. Sinuses/Orbits: No acute finding. Other: None. IMPRESSION: 1. No acute intracranial abnormality. Electronically Signed   By: Obie Dredge M.D.   On: 05/07/2021 12:54   ? ? ?Scheduled Meds: ? amLODipine  10 mg Oral Daily  ? enoxaparin (LOVENOX) injection  40 mg Subcutaneous Q24H  ? multivitamin  1 tablet Oral Weekly  ? sodium chloride flush  3 mL Intravenous Q12H  ? ?Continuous Infusions: ? ampicillin (OMNIPEN) IV 2 g (05/08/21 1547)  ? ? ? LOS: 1 day  ? ?Time spent: 37 ? ?Rhetta Mura, MD ?[facility] ?To contact the attending provider between 7A-7P or the covering provider during after hours 7P-7A, please log into the web site [email or web address] and access using universal [place] password for that web site. If you do not have the password, please call the hospital operator. ? ?05/08/2021, 3:13 PM  ? ? ?

## 2021-05-08 NOTE — Progress Notes (Signed)
?  Transition of Care (TOC) Screening Note ? ? ?Patient Details  ?Name: Ashley Duffy ?Date of Birth: 09-25-1955 ? ? ?Transition of Care (TOC) CM/SW Contact:    ?Addasyn Mcbreen, Meriam Sprague, RN ?Phone Number: ?05/08/2021, 2:01 PM ? ? ? ?Transition of Care Department [facility]) has reviewed patient and no TOC needs have been identified at this time. We will continue to monitor patient advancement through interdisciplinary progression rounds. If new patient transition needs arise, please place a TOC consult. ?  ?

## 2021-05-08 NOTE — Consult Note (Signed)
?   ? ? ? ? ?[facility] Infectious Disease   ? ?Date of Admission:  05/07/2021   Total days of inpatient antibiotics 1 ? ?      ?Reason for Consult: Meningitis    ?Principal Problem: ?  Headache, acute ?Active Problems: ?  Listerial meningitis ? ? ?Assessment: ?32 YF with DMII admitted for work up of possible listeria CNS infection. ?  ?#Suspected meningitis ?#Possible consumption of listeria contaminated shrimp ?-Pt reports had eaten shrimp that MAY have have been contaminated with listeria monocytogenes. She received notification via email that there was a recall on cocktail shrimp by [facility]. Note is in media tab.  ?-She states initially she had malaise followed by photophobia, brain fog and headache.   ?-SP LP with CSF studies: 1wbc, 50glc, 64PTN, CSF Cx pending. The only concerning finding is slightly elevated PTN(can be seen with listeria). As such will follow csf Cx and meningitis panel added. ?Recommendations:  ?-Continue Ampicillin ?2gm q4h. Hold off on synergistic coverage with gentamycin for now as given relatively unremarkable csf studies.  ?-Follow CSF Cx ?-Follow blood Cx ?-Added meningitis panel ?Microbiology:   ?Antibiotics: ?Ampicillin 3/6-p ? ?Cultures: ?Blood ?3/6 pending ?CSFL 1wbc, 50glc, 64PTN, CSF Cx pending ? ? ?HPI: Ashley Duffy is a 66 y.o. female with HTN, DMII, HLD, adjustment disorder admitted for possible meningitis 2/2 listeria. Pt reports eating cocktail shrimp from [facility] on 2/26. Following this she had some malaise but ended up eating the rest of the shrimp a couple days later. She subsequently developed photophobia, HA, brain fog and worsening malaise. She received an email from [facility] that about voluntary shrimp recall due listeria contamination. she report that this was the brand of shrimp she had consumed. Present to ED with symptoms. They had paged myself and recommend LP and start ampicillin. ?Today, she reports phenophobia and HA have improved.  ? ? ?Review of  Systems: ?Review of Systems  ?All other systems reviewed and are negative. ? ?Past Medical History:  ?Diagnosis Date  ? Anxiety   ? Colon polyp   ? Depression   ? Diverticulitis   ? Diverticulosis   ? Hyperlipidemia   ? Hypertension   ? Panic attack as reaction to stress 11/27/2015  ? ? ?Social History  ? ?Tobacco Use  ? Smoking status: Former  ?  Types: Cigarettes  ? Smokeless tobacco: Never  ?Vaping Use  ? Vaping Use: Never used  ?Substance Use Topics  ? Alcohol use: Yes  ?  Comment: Pt quit last drink was 2 months ago  ? Drug use: No  ? ? ?Family History  ?Problem Relation Age of Onset  ? Alzheimer's disease Mother   ? Breast cancer Mother 55  ? Hypertension Mother   ? Thyroid disease Mother   ? Alcohol abuse Father   ? Colon cancer Father   ? Arthritis Brother   ? Hypertension Brother   ? Breast cancer Maternal Aunt 43  ? Breast cancer Maternal Grandmother 27  ? Diabetes Neg Hx   ? ?Scheduled Meds: ? amLODipine  10 mg Oral Daily  ? enoxaparin (LOVENOX) injection  40 mg Subcutaneous Q24H  ? multivitamin  1 tablet Oral Weekly  ? sodium chloride flush  3 mL Intravenous Q12H  ? ?Continuous Infusions: ? sodium chloride 75 mL/hr at 05/07/21 1842  ? ampicillin (OMNIPEN) IV 2 g (05/08/21 1206)  ? ?PRN Meds:.ALPRAZolam, ALPRAZolam, ketorolac ?Allergies  ?Allergen Reactions  ? Atorvastatin Other (See Comments)  ?  Severe  myalgias  ? ? ?OBJECTIVE: ?Blood pressure 136/69, pulse 66, temperature (!) 97.5 ?F (36.4 ?C), temperature source Oral, resp. rate 20, height 5\' 5"  (1.651 m), weight 77.1 kg, SpO2 98 %. ? ?Physical Exam ?Constitutional:   ?   Appearance: Normal appearance.  ?HENT:  ?   Head: Normocephalic and atraumatic.  ?   Right Ear: Tympanic membrane normal.  ?   Left Ear: Tympanic membrane normal.  ?   Nose: Nose normal.  ?   Mouth/Throat:  ?   Mouth: Mucous membranes are moist.  ?Eyes:  ?   Extraocular Movements: Extraocular movements intact.  ?   Conjunctiva/sclera: Conjunctivae normal.  ?   Pupils: Pupils are  equal, round, and reactive to light.  ?Cardiovascular:  ?   Rate and Rhythm: Normal rate and regular rhythm.  ?   Heart sounds: No murmur heard. ?  No friction rub. No gallop.  ?Pulmonary:  ?   Effort: Pulmonary effort is normal.  ?   Breath sounds: Normal breath sounds.  ?Abdominal:  ?   General: Abdomen is flat.  ?   Palpations: Abdomen is soft.  ?Musculoskeletal:     ?   General: Normal range of motion.  ?Skin: ?   General: Skin is warm and dry.  ?Neurological:  ?   General: No focal deficit present.  ?   Mental Status: She is alert and oriented to person, place, and time.  ?Psychiatric:     ?   Mood and Affect: Mood normal.  ? ? ?Lab Results ?Lab Results  ?Component Value Date  ? WBC 6.6 05/08/2021  ? HGB 12.7 05/08/2021  ? HCT 38.7 05/08/2021  ? MCV 91.7 05/08/2021  ? PLT 310 05/08/2021  ?  ?Lab Results  ?Component Value Date  ? CREATININE 0.67 05/08/2021  ? BUN 14 05/08/2021  ? NA 139 05/08/2021  ? K 4.1 05/08/2021  ? CL 108 05/08/2021  ? CO2 23 05/08/2021  ?  ?Lab Results  ?Component Value Date  ? ALT 48 (H) 05/08/2021  ? AST 24 05/08/2021  ? ALKPHOS 82 05/08/2021  ? BILITOT 0.4 05/08/2021  ?  ? ? ? ?Laurice Record, MD ?[facility] ?[facility] ?05/08/2021, 2:05 PM  ?

## 2021-05-09 DIAGNOSIS — G44209 Tension-type headache, unspecified, not intractable: Secondary | ICD-10-CM | POA: Diagnosis not present

## 2021-05-09 LAB — MISC LABCORP TEST (SEND OUT): Labcorp test code: 2013305

## 2021-05-09 NOTE — Discharge Summary (Signed)
Discharge Summary  Ashley Duffy ZOX:[id number] DOB: 07-29-1955  PCP: Willow Ora, MD  Admit date: 05/07/2021 Discharge date: 05/09/2021  Time spent: 35 minutes   Recommendations for Outpatient Follow-up:  Follow-up with your primary care provider in 1 to 2 weeks.  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    Discharge Condition: Stable  Diet recommendation: Resume previous diet.  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  Symptoms resolved on 05/09/2021.  Infectious disease recommended to DC ampicillin.  05/09/2021: Patient was seen at her bedside.  Okay to discharge to home without antibiotics.  Infectious disease.  She has no new complaints.  She is eager to go home.   Hospital Course:  Principal Problem:   Headache, acute Active Problems:   Listerial meningitis  #HA/photophobia-resolved #Possible consumption of listeria contaminated shrimp -Pt reports had eaten shrimp that MAY have have been contaminated with listeria monocytogenes. She received notification via email that there was a recall on cocktail shrimp by [facility]. Note is in media tab.  -SP LP with CSF studies: 1wbc, 50glc, 64PTN, CSF Cx NG x 2 days.  -She states initially she had malaise followed by photophobia, brain fog and headache which all have resolved at this point. As her CSF Cx are negative we an stop antibiotics as  there is no evidence of listeria CNS infection. She may have had a viral infection given mildly elevated CSF protein but as her symptoms have resolved does not need further intervention.      Recommendations:  -D/C Ampicillin -Follow-up meningitis panel/CSF Cx -Follow-up scheduled to go over meningitis panel on 05/21/21 with infectious disease, Dr. Thedore Mins. Microbiology:   Antibiotics: Ampicillin 3/6-05/09/2021.   Cultures: Blood 3/6 pending CSFL 1wbc, 50glc, 64PTN, CSF Cx NG x2        Procedures: LP.  Consultations: Infectious disease  Discharge Exam: BP 125/86 (BP Location: Left Arm)    Pulse 69    Temp 97.9 F (36.6 C) (Oral)    Resp 16    Ht  (1.651 m)    Wt 77.1 kg    SpO2 95%    BMI 28.29 kg/m  General: 66 y.o. year-old female well developed well nourished in no acute distress.  Alert and oriented x3. Cardiovascular: Regular rate and rhythm with no rubs or gallops.  No thyromegaly or JVD noted.   Respiratory: Clear to auscultation with no wheezes or rales. Good inspiratory effort. Abdomen: Soft nontender nondistended with normal bowel sounds x4 quadrants. Musculoskeletal: No lower extremity edema. 2/4 pulses in all 4 extremities. Skin: No ulcerative lesions noted or rashes, Psychiatry: Mood is appropriate for condition and setting  Discharge Instructions You were cared for by a hospitalist during your hospital stay. If you have any questions about your discharge medications or the care you received while you were in the hospital after you are discharged, you  can call the unit and asked to speak with the hospitalist on call if the hospitalist that took care of you is not available. Once you are discharged, your primary care physician will handle any further medical issues. Please note that NO REFILLS for any discharge medications will be authorized once you are discharged, as it is imperative that you return to your primary care physician (or establish a relationship with  a primary care physician if you do not have one) for your aftercare needs so that they can reassess your need for medications and monitor your lab values.   Allergies as of 05/09/2021       Reactions   Atorvastatin Other (See Comments)   Severe myalgias        Medication List     STOP taking these medications    ALPRAZolam 0.25 MG tablet Commonly known as: XANAX   lovastatin 20 MG tablet Commonly known as: MEVACOR   rosuvastatin 5 MG tablet Commonly known as: Crestor       TAKE these medications    amLODipine 10 MG tablet Commonly known as: NORVASC Take 1 tablet (10 mg total) by mouth daily.   MULTIVITAMIN ADULT PO Take 1 tablet by mouth once a week.   Ocuvite Extra Tabs Take 1 tablet by mouth once a week.   VITAMIN D PO Take 1 tablet by mouth daily.       Allergies  Allergen Reactions   Atorvastatin Other (See Comments)    Severe myalgias    Follow-up Information     Willow Ora, MD. Call today.   Specialty: Family Medicine Contact information: [address] [place] [address] [phone]                  The results of significant diagnostics from this hospitalization (including imaging, microbiology, ancillary and laboratory) are listed below for reference.    Significant Diagnostic Studies: CT Head Wo Contrast  Result Date: 05/07/2021 CLINICAL DATA:  Sudden onset headache and photophobia. EXAM: CT HEAD WITHOUT CONTRAST TECHNIQUE: Contiguous axial images were obtained from the base of the skull through the vertex without intravenous contrast. RADIATION DOSE REDUCTION: This exam was performed according to the departmental dose-optimization program which includes automated exposure control, adjustment of the mA and/or kV according to patient size and/or use of iterative reconstruction technique. COMPARISON:  None. FINDINGS: Brain: No evidence of acute infarction, hemorrhage, hydrocephalus, extra-axial collection or mass lesion/mass  effect. Vascular: Atherosclerotic vascular calcification of the carotid siphons. No hyperdense vessel. Skull: Normal. Negative for fracture or focal lesion. Sinuses/Orbits: No acute finding. Other: None. IMPRESSION: 1. No acute intracranial abnormality. Electronically Signed   By: Obie Dredge M.D.   On: 05/07/2021 12:54    Microbiology: Recent Results (from the past 240 hour(s))  Blood culture (routine x 2)     Status: None (Preliminary result)   Collection Time: 05/07/21 12:09 PM   Specimen: BLOOD  Result Value Ref Range Status   Specimen Description   Final    BLOOD LEFT ANTECUBITAL Performed at [facility], [address] [place], [place] [address]    Special Requests   Final    Blood Culture adequate volume BOTTLES DRAWN AEROBIC AND ANAEROBIC Performed at [facility], [address], [place], [place] [address]    Culture   Final    NO GROWTH 2 DAYS Performed at [facility], [address], [place], [place] [address]  Report Status PENDING  Incomplete  Blood culture (routine x 2)     Status: None (Preliminary result)   Collection Time: 05/07/21  1:15 PM   Specimen: BLOOD  Result Value Ref Range Status   Specimen Description   Final    BLOOD RIGHT ANTECUBITAL Performed at [facility], [address] [place], [place] [address]    Special Requests   Final    BOTTLES DRAWN AEROBIC AND ANAEROBIC Blood Culture adequate volume Performed at [facility], [address] [place], [place] [address]    Culture   Final    NO GROWTH 2 DAYS Performed at [facility], [address] [place], [place] [address]    Report Status PENDING  Incomplete  Resp Panel by RT-PCR (Flu A&B, Covid) Nasopharyngeal Swab     Status: None   Collection Time: 05/07/21  1:25 PM   Specimen: Nasopharyngeal Swab; Nasopharyngeal(NP) swabs in vial transport medium  Result Value Ref Range Status   SARS Coronavirus 2 by RT PCR NEGATIVE NEGATIVE Final     Comment: (NOTE) SARS-CoV-2 target nucleic acids are NOT DETECTED.  The SARS-CoV-2 RNA is generally detectable in upper respiratory specimens during the acute phase of infection. The lowest concentration of SARS-CoV-2 viral copies this assay can detect is 138 copies/mL. A negative result does not preclude SARS-Cov-2 infection and should not be used as the sole basis for treatment or other patient management decisions. A negative result may occur with  improper specimen collection/handling, submission of specimen other than nasopharyngeal swab, presence of viral mutation(s) within the areas targeted by this assay, and inadequate number of viral copies(<138 copies/mL). A negative result must be combined with clinical observations, patient history, and epidemiological information. The expected result is Negative.  Fact Sheet for Patients:  BloggerCourse.comhttps://www.fda.gov/media/152166/download  Fact Sheet for Healthcare Providers:  SeriousBroker.ithttps://www.fda.gov/media/152162/download  This test is no t yet approved or cleared by the [place] FDA and  has been authorized for detection and/or diagnosis of SARS-CoV-2 by FDA under an Emergency Use Authorization (EUA). This EUA will remain  in effect (meaning this test can be used) for the duration of the COVID-19 declaration under Section 564(b)(1) of the Act, 21 U.S.C.section 360bbb-3(b)(1), unless the authorization is terminated  or revoked sooner.       Influenza A by PCR NEGATIVE NEGATIVE Final   Influenza B by PCR NEGATIVE NEGATIVE Final    Comment: (NOTE) The Xpert Xpress SARS-CoV-2/FLU/RSV plus assay is intended as an aid in the diagnosis of influenza from Nasopharyngeal swab specimens and should not be used as a sole basis for treatment. Nasal washings and aspirates are unacceptable for Xpert Xpress SARS-CoV-2/FLU/RSV testing.  Fact Sheet for Patients: BloggerCourse.comhttps://www.fda.gov/media/152166/download  Fact Sheet for Healthcare  Providers: SeriousBroker.ithttps://www.fda.gov/media/152162/download  This test is not yet approved or cleared by the [place] FDA and has been authorized for detection and/or diagnosis of SARS-CoV-2 by FDA under an Emergency Use Authorization (EUA). This EUA will remain in effect (meaning this test can be used) for the duration of the COVID-19 declaration under Section 564(b)(1) of the Act, 21 U.S.C. section 360bbb-3(b)(1), unless the authorization is terminated or revoked.  Performed at [facility], [address] [place], [place] [address]   CSF culture w Gram Stain     Status: None (Preliminary result)   Collection Time: 05/07/21  3:00 PM   Specimen: CSF; Cerebrospinal Fluid  Result Value Ref Range Status   Specimen Description  Final    CSF Performed at [facility], [address] [place], [place] [address]    Special Requests   Final    NONE Performed at [facility], [address] [place], [place] [address]    Gram Stain   Final    WBC PRESENT, PREDOMINANTLY MONONUCLEAR NO ORGANISMS SEEN CYTOSPIN SMEAR    Culture   Final    NO GROWTH 2 DAYS Performed at [facility], [address], [place], [place] [address]    Report Status PENDING  Incomplete     Labs: Basic Metabolic Panel: Recent Labs  Lab 05/07/21 1408 05/08/21 0602  NA 138 139  K 3.5 4.1  CL 104 108  CO2 24 23  GLUCOSE 95 92  BUN 14 14  CREATININE 0.66 0.67  CALCIUM 9.8 9.4   Liver Function Tests: Recent Labs  Lab 05/07/21 1408 05/08/21 0602  AST 36 24  ALT 65* 48*  ALKPHOS 95 82  BILITOT 0.7 0.4  PROT 6.7 6.2*  ALBUMIN 4.1 3.7   No results for input(s): LIPASE, AMYLASE in the last 168 hours. No results for input(s): AMMONIA in the last 168 hours. CBC: Recent Labs  Lab 05/07/21 1408 05/08/21 0602  WBC 8.1 6.6  NEUTROABS 5.1  --   HGB 13.3 12.7  HCT 39.7 38.7  MCV 88.4 91.7  PLT 341 310   Cardiac Enzymes: No results for input(s): CKTOTAL,  CKMB, CKMBINDEX, TROPONINI in the last 168 hours. BNP: BNP (last 3 results) No results for input(s): BNP in the last 8760 hours.  ProBNP (last 3 results) No results for input(s): PROBNP in the last 8760 hours.  CBG: No results for input(s): GLUCAP in the last 168 hours.     Signed:  Darlin Drop, MD [facility] 05/09/2021, 4:40 PM

## 2021-05-09 NOTE — Discharge Planning (Signed)
0700-12:00 ?Patient's discharge instructions went over with patient and patients upcoming appts went over with patient, Patient walked down to transportation and belongings with patient.  ?

## 2021-05-09 NOTE — Consult Note (Signed)
?   ? ? ? ? ?[facility] ? ?Date of Admission:  05/07/2021   Total days of inpatient antibiotics 2 ? ?Principal Problem: ?  Headache, acute ?Active Problems: ?  Listerial meningitis ?     ?    ?Assessment: ?61 YF with DMII admitted for work up of possible listeria CNS infection. ?  ?#HA/photophobia-resolved ?#Possible consumption of listeria contaminated shrimp ?-Pt reports had eaten shrimp that MAY have have been contaminated with listeria monocytogenes. She received notification via email that there was a recall on cocktail shrimp by [facility]. Note is in media tab.  ?-SP LP with CSF studies: 1wbc, 50glc, 64PTN, CSF Cx NG x 2 days.  ?-She states initially she had malaise followed by photophobia, brain fog and headache which all have resolved at this point. As her CSF Cx are negative we an stop antibiotics as there is no evidence of listeria CNS infection. She may have had a viral infection given mildly elevated CSF protein but as her symptoms have resolved does not need further intervention.  ? ? ?Recommendations:  ?-D/C Ampicillin ?-Follow-up meningitis panel/CSF Cx ?-Follow-up scheduled to go over meningitis panel on 3/20 with myself.  ?Microbiology:   ?Antibiotics: ?Ampicillin 3/6-p ?  ?Cultures: ?Blood ?3/6 pending ?CSFL 1wbc, 50glc, 64PTN, CSF Cx NG x2 ? ? ?SUBJECTIVE: ?Resting in bed. No new complaints. Reports HA/ photophobia have resolved.  ? ?Review of Systems: ?Review of Systems  ?All other systems reviewed and are negative. ? ? ?Scheduled Meds: ? amLODipine  10 mg Oral Daily  ? enoxaparin (LOVENOX) injection  40 mg Subcutaneous Q24H  ? multivitamin  1 tablet Oral Weekly  ? sodium chloride flush  3 mL Intravenous Q12H  ? ?Continuous Infusions: ?PRN Meds:.ALPRAZolam, ALPRAZolam, ketorolac ?Allergies  ?Allergen Reactions  ? Atorvastatin Other (See Comments)  ?  Severe myalgias  ? ? ?OBJECTIVE: ?Vitals:  ? 05/08/21 0509 05/08/21 1340 05/08/21 2127 05/09/21 0541  ?BP: 106/71 136/69  115/62 125/86  ?Pulse: 67 66 63 69  ?Resp: 16 20 16 16   ?Temp: 98.2 ?F (36.8 ?C) (!) 97.5 ?F (36.4 ?C) 98 ?F (36.7 ?C) 97.9 ?F (36.6 ?C)  ?TempSrc: Oral Oral Oral Oral  ?SpO2: 96% 98% 95% 95%  ?Weight:      ?Height:      ? ?Body mass index is 28.29 kg/m?. ? ?Physical Exam ?Constitutional:   ?   Appearance: Normal appearance.  ?HENT:  ?   Head: Normocephalic and atraumatic.  ?   Right Ear: Tympanic membrane normal.  ?   Left Ear: Tympanic membrane normal.  ?   Nose: Nose normal.  ?   Mouth/Throat:  ?   Mouth: Mucous membranes are moist.  ?Eyes:  ?   Extraocular Movements: Extraocular movements intact.  ?   Conjunctiva/sclera: Conjunctivae normal.  ?   Pupils: Pupils are equal, round, and reactive to light.  ?Cardiovascular:  ?   Rate and Rhythm: Normal rate and regular rhythm.  ?   Heart sounds: No murmur heard. ?  No friction rub. No gallop.  ?Pulmonary:  ?   Effort: Pulmonary effort is normal.  ?   Breath sounds: Normal breath sounds.  ?Abdominal:  ?   General: Abdomen is flat.  ?   Palpations: Abdomen is soft.  ?Musculoskeletal:     ?   General: Normal range of motion.  ?Skin: ?   General: Skin is warm and dry.  ?Neurological:  ?   General: No focal deficit present.  ?  Mental Status: She is alert and oriented to person, place, and time.  ?Psychiatric:     ?   Mood and Affect: Mood normal.  ? ? ? ? ?Lab Results ?Lab Results  ?Component Value Date  ? WBC 6.6 05/08/2021  ? HGB 12.7 05/08/2021  ? HCT 38.7 05/08/2021  ? MCV 91.7 05/08/2021  ? PLT 310 05/08/2021  ?  ?Lab Results  ?Component Value Date  ? CREATININE 0.67 05/08/2021  ? BUN 14 05/08/2021  ? NA 139 05/08/2021  ? K 4.1 05/08/2021  ? CL 108 05/08/2021  ? CO2 23 05/08/2021  ?  ?Lab Results  ?Component Value Date  ? ALT 48 (H) 05/08/2021  ? AST 24 05/08/2021  ? ALKPHOS 82 05/08/2021  ? BILITOT 0.4 05/08/2021  ?  ? ? ? ? ?Laurice Record, MD ?[facility] ?[facility] ?05/09/2021, 2:47 PM  ?

## 2021-05-10 ENCOUNTER — Encounter: Payer: Self-pay | Admitting: Family Medicine

## 2021-05-10 ENCOUNTER — Ambulatory Visit (INDEPENDENT_AMBULATORY_CARE_PROVIDER_SITE_OTHER): Payer: Medicare Other | Admitting: Family Medicine

## 2021-05-10 VITALS — BP 138/86 | HR 79 | Temp 97.9°F | Ht 65.0 in | Wt 174.2 lb

## 2021-05-10 DIAGNOSIS — A879 Viral meningitis, unspecified: Secondary | ICD-10-CM

## 2021-05-10 DIAGNOSIS — G720 Drug-induced myopathy: Secondary | ICD-10-CM

## 2021-05-10 DIAGNOSIS — T466X5A Adverse effect of antihyperlipidemic and antiarteriosclerotic drugs, initial encounter: Secondary | ICD-10-CM

## 2021-05-10 DIAGNOSIS — I10 Essential (primary) hypertension: Secondary | ICD-10-CM | POA: Diagnosis not present

## 2021-05-10 NOTE — Patient Instructions (Signed)
Please follow up as scheduled for your next visit with me: 07/31/2021  ? ?I will recheck your cholesterol then and then decide if trying a new cholesterol medication is needed at that time.  ? ?Eat well! ?More plants, grains, nuts and berries! ? ?If you have any questions or concerns, please don't hesitate to send me a message via MyChart or call the office at [phone]. Thank you for visiting with [place] today! It's our pleasure caring for you.  ?

## 2021-05-10 NOTE — Progress Notes (Signed)
Subjective  CC:  Chief Complaint  Patient presents with   Follow-up    ED; 05/07/2021 Acute headache/Listerial Meningitis. She has finished round of ABX. Pt states that she is feeling better.     HPI: Ashley Duffy is a 66 y.o. female who presents to the office today to address the problems listed above in the chief complaint. Hospitalized 3/6-3/8 due to suspected listeria meningitis from contaminated shrimp: had headache, brain fog, photophobia and nausea. L/P was mostly unremarkable and CSF cultures were negative. Treated with ampicillin for 48 hours. Reviewed all notes, labs and f/u recs.  Since home, feeling better. No longer with headache or nausea. Mild left photo sensitvity but otherwise ok.   HLD intoleratnt to 2 statins HTN   Assessment  1. Viral meningitis, unspecified   2. Statin myopathy   3. Essential hypertension      Plan  Possible viral meningitis vs viral syndrome:  listeria unlikely. Has f/u with ID in a few weeks to review final meningitis lab panel results. No further treatment/abx indicated. Supportive care with good rest, hydration and nutrition.  Will reassess lipids at her cpe in may and consider zetia if indicated  Follow up: cpe in may  05/21/2021  No orders of the defined types were placed in this encounter.  No orders of the defined types were placed in this encounter.     I reviewed the patients updated PMH, FH, and SocHx.    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   Multiple Vitamin (MULTIVITAMIN ADULT PO) Take 1 tablet by mouth once a week.   Multiple Vitamins-Minerals (OCUVITE EXTRA) TABS Take 1 tablet by mouth once a week.   VITAMIN D PO Take 1 tablet by mouth daily.    Allergies: Patient is allergic to atorvastatin. Family History: Patient family history includes Alcohol abuse in her father; Alzheimer's disease in her mother; Arthritis in her brother; Breast cancer (age of onset: 25) in her mother; Breast cancer (age of onset: 61) in her maternal grandmother; Breast cancer (age of onset: 41) in her maternal aunt; Colon cancer in her father; Hypertension in her brother and mother; Thyroid disease in her mother. Social History:  Patient  reports that she has quit smoking. Her smoking use included cigarettes. She has never used smokeless tobacco. She reports current alcohol use. She reports that she does not use drugs.  Review of Systems: Constitutional: Negative for fever malaise or anorexia Cardiovascular: negative for chest pain Respiratory: negative for SOB or persistent cough Gastrointestinal: negative for abdominal pain  Objective  Vitals: BP 138/86    Pulse 79    Temp 97.9 F (36.6 C) (Temporal)    Ht 5\' 5"  (1.651 m)    Wt 174 lb 3.2 oz (79 kg)    SpO2 97%  BMI 28.99 kg/m  General: no acute distress , A&Ox3 HEENT: PEERL, conjunctiva normal, neck is supple Cardiovascular:  RRR without murmur or gallop.  Respiratory:  Good breath sounds bilaterally, CTAB with normal respiratory effort Skin:  Warm, no rashes  No visits with results within 1 Day(s) from this visit.  Latest known visit with results is:  Admission on 05/07/2021, Discharged on 05/09/2021  Component Date Value Ref Range Status   WBC 05/07/2021 8.1  4.0 - 10.5 K/uL Final   RBC 05/07/2021 4.49  3.87 - 5.11 MIL/uL Final   Hemoglobin 05/07/2021  13.3  12.0 - 15.0 g/dL Final   HCT 05/07/2021 39.7  36.0 - 46.0 % Final   MCV 05/07/2021 88.4  80.0 - 100.0 fL Final   MCH 05/07/2021 29.6  26.0 - 34.0 pg Final   MCHC 05/07/2021 33.5  30.0 - 36.0 g/dL Final   RDW 05/07/2021 14.1  11.5 - 15.5 % Final   Platelets 05/07/2021 341  150 - 400 K/uL Final   nRBC 05/07/2021 0.0  0.0 - 0.2 % Final   Neutrophils Relative % 05/07/2021 63  % Final   Neutro Abs 05/07/2021 5.1  1.7 - 7.7 K/uL Final   Lymphocytes Relative 05/07/2021 27  % Final   Lymphs Abs 05/07/2021 2.2  0.7 - 4.0 K/uL Final   Monocytes Relative 05/07/2021 8  % Final   Monocytes Absolute 05/07/2021 0.7  0.1 - 1.0 K/uL Final   Eosinophils Relative 05/07/2021 1  % Final   Eosinophils Absolute 05/07/2021 0.1  0.0 - 0.5 K/uL Final   Basophils Relative 05/07/2021 0  % Final   Basophils Absolute 05/07/2021 0.0  0.0 - 0.1 K/uL Final   Immature Granulocytes 05/07/2021 1  % Final   Abs Immature Granulocytes 05/07/2021 0.04  0.00 - 0.07 K/uL Final   Specimen Description 05/07/2021    Final                   Value:BLOOD LEFT ANTECUBITAL Performed at [facility], [address], [place], [place] [address]    Special Requests 05/07/2021    Final                   Value:Blood Culture adequate volume BOTTLES DRAWN AEROBIC AND ANAEROBIC Performed at [facility], [address], [place], [place] [address]    Culture 05/07/2021    Final                   Value:NO GROWTH 3 DAYS Performed at [facility], [place] [address] [place], [place] [address]    Report Status 05/07/2021 PENDING   Incomplete   Specimen Description 05/07/2021    Final                   Value:BLOOD RIGHT ANTECUBITAL Performed at [facility], [place] [address], [place], [place] [address]    Special Requests 05/07/2021    Final                   Value:BOTTLES DRAWN AEROBIC AND ANAEROBIC Blood Culture adequate volume Performed at [facility], [address], [place], [place]  [address]    Culture 05/07/2021    Final                   Value:NO GROWTH 3 DAYS Performed at [facility], [place] [address], [place], [place] [address]  Report Status 05/07/2021 PENDING   Incomplete   Sodium 05/07/2021 138  135 - 145 mmol/L Final   Potassium 05/07/2021 3.5  3.5 - 5.1 mmol/L Final   Chloride 05/07/2021 104  98 - 111 mmol/L Final   CO2 05/07/2021 24  22 - 32 mmol/L Final   Glucose, Bld 05/07/2021 95  70 - 99 mg/dL Final   BUN 84/69/6295 14  8 - 23 mg/dL Final   Creatinine, Ser 05/07/2021 0.66  0.44 - 1.00 mg/dL Final   Calcium 28/41/3244 9.8  8.9 - 10.3 mg/dL Final   Total Protein 03/06/7251 6.7  6.5 - 8.1 g/dL Final   Albumin 66/44/0347 4.1  3.5 - 5.0 g/dL Final   AST 42/59/5638 36  15 - 41 U/L Final   ALT 05/07/2021 65 (H)  0 - 44 U/L Final   Alkaline Phosphatase 05/07/2021 95  38 - 126 U/L Final   Total Bilirubin 05/07/2021 0.7  0.3 - 1.2 mg/dL Final   GFR, Estimated 05/07/2021 >60  >60 mL/min Final   Anion gap 05/07/2021 10  5 - 15 Final   SARS Coronavirus 2 by RT PCR 05/07/2021 NEGATIVE  NEGATIVE Final   Influenza A by PCR 05/07/2021 NEGATIVE  NEGATIVE Final   Influenza B by PCR 05/07/2021 NEGATIVE  NEGATIVE Final   Tube # 05/07/2021 4   Corrected   Color, CSF 05/07/2021 COLORLESS  COLORLESS Final   Appearance, CSF 05/07/2021 CLEAR (A)  CLEAR Final   Supernatant 05/07/2021 NOT INDICATED   Final   RBC Count, CSF 05/07/2021 1 (H)  0 /cu mm Final   WBC, CSF 05/07/2021 2  0 - 5 /cu mm Final   Other Cells, CSF 05/07/2021 TOO FEW TO COUNT, SMEAR AVAILABLE FOR REVIEW   Final   Tube # 05/07/2021 2   Final   Color, CSF 05/07/2021 COLORLESS  COLORLESS Final   Appearance, CSF 05/07/2021 CLEAR (A)  CLEAR Final   Supernatant 05/07/2021 NOT INDICATED   Final   RBC Count, CSF 05/07/2021 1 (H)  0 /cu mm Final   WBC, CSF 05/07/2021 1  0 - 5 /cu mm Final   Other Cells, CSF 05/07/2021 TOO FEW TO COUNT, SMEAR AVAILABLE FOR REVIEW   Final   Specimen Description 05/07/2021     Final                   Value:CSF Performed at [facility], [address] [place], [place] [address]    Special Requests 05/07/2021    Final                   Value:NONE Performed at [facility], [address] [place], [place] [address]    Gram Stain 05/07/2021    Final                   Value:WBC PRESENT, PREDOMINANTLY MONONUCLEAR NO ORGANISMS SEEN CYTOSPIN SMEAR    Culture 05/07/2021    Final                   Value:NO GROWTH 3 DAYS Performed at [facility], [address] [place], [place] [address]    Report Status 05/07/2021 PENDING   Incomplete   Glucose, CSF 05/07/2021 50  40 - 70 mg/dL Final   Total  Protein, CSF 05/07/2021 64 (H)  15 - 45 mg/dL Final   HIV Screen 4th Generation wRfx 05/08/2021 Non Reactive  Non Reactive  Final   Sodium 05/08/2021 139  135 - 145 mmol/L Final   Potassium 05/08/2021 4.1  3.5 - 5.1 mmol/L Final   Chloride 05/08/2021 108  98 - 111 mmol/L Final   CO2 05/08/2021 23  22 - 32 mmol/L Final   Glucose, Bld 05/08/2021 92  70 - 99 mg/dL Final   BUN 05/08/2021 14  8 - 23 mg/dL Final   Creatinine, Ser 05/08/2021 0.67  0.44 - 1.00 mg/dL Final   Calcium 05/08/2021 9.4  8.9 - 10.3 mg/dL Final   Total Protein 05/08/2021 6.2 (L)  6.5 - 8.1 g/dL Final   Albumin 05/08/2021 3.7  3.5 - 5.0 g/dL Final   AST 05/08/2021 24  15 - 41 U/L Final   ALT 05/08/2021 48 (H)  0 - 44 U/L Final   Alkaline Phosphatase 05/08/2021 82  38 - 126 U/L Final   Total Bilirubin 05/08/2021 0.4  0.3 - 1.2 mg/dL Final   GFR, Estimated 05/08/2021 >60  >60 mL/min Final   Anion gap 05/08/2021 8  5 - 15 Final   WBC 05/08/2021 6.6  4.0 - 10.5 K/uL Final   RBC 05/08/2021 4.22  3.87 - 5.11 MIL/uL Final   Hemoglobin 05/08/2021 12.7  12.0 - 15.0 g/dL Final   HCT 05/08/2021 38.7  36.0 - 46.0 % Final   MCV 05/08/2021 91.7  80.0 - 100.0 fL Final   MCH 05/08/2021 30.1  26.0 - 34.0 pg Final   MCHC 05/08/2021 32.8  30.0 - 36.0 g/dL Final   RDW 05/08/2021 14.3   11.5 - 15.5 % Final   Platelets 05/08/2021 310  150 - 400 K/uL Final   nRBC 05/08/2021 0.0  0.0 - 0.2 % Final   Prothrombin Time 05/08/2021 13.0  11.4 - 15.2 seconds Final   INR 05/08/2021 1.0  0.8 - 1.2 Final   [facility] test code 05/08/2021 [id number]   Final   [facility] test name 05/08/2021 MENINGITIS ENCEPHALITIS PANEL [place] [id number]   Final   Misc [facility] result 05/08/2021 COMMENT   Final     Commons side effects, risks, benefits, and alternatives for medications and treatment plan prescribed today were discussed, and the patient expressed understanding of the given instructions. Patient is instructed to call or message via MyChart if he/she has any questions or concerns regarding our treatment plan. No barriers to understanding were identified. We discussed Red Flag symptoms and signs in detail. Patient expressed understanding regarding what to do in case of urgent or emergency type symptoms.  Medication list was reconciled, printed and provided to the patient in AVS. Patient instructions and summary information was reviewed with the patient as documented in the AVS. This note was prepared with assistance of Dragon voice recognition software. Occasional wrong-word or sound-a-like substitutions may have occurred due to the inherent limitations of voice recognition software  This visit occurred during the SARS-CoV-2 public health emergency.  Safety protocols were in place, including screening questions prior to the visit, additional usage of staff PPE, and extensive cleaning of exam room while observing appropriate contact time as indicated for disinfecting solutions.

## 2021-05-11 LAB — CSF CULTURE W GRAM STAIN: Culture: NO GROWTH

## 2021-05-12 LAB — CULTURE, BLOOD (ROUTINE X 2)
Culture: NO GROWTH
Culture: NO GROWTH
Special Requests: ADEQUATE
Special Requests: ADEQUATE

## 2021-05-15 DIAGNOSIS — Z6830 Body mass index (BMI) 30.0-30.9, adult: Secondary | ICD-10-CM | POA: Diagnosis not present

## 2021-05-15 DIAGNOSIS — Z124 Encounter for screening for malignant neoplasm of cervix: Secondary | ICD-10-CM | POA: Diagnosis not present

## 2021-05-15 DIAGNOSIS — Z1231 Encounter for screening mammogram for malignant neoplasm of breast: Secondary | ICD-10-CM | POA: Diagnosis not present

## 2021-05-15 DIAGNOSIS — Z01419 Encounter for gynecological examination (general) (routine) without abnormal findings: Secondary | ICD-10-CM | POA: Diagnosis not present

## 2021-05-21 ENCOUNTER — Ambulatory Visit: Payer: Medicare Other | Admitting: Internal Medicine

## 2021-05-21 ENCOUNTER — Encounter: Payer: Self-pay | Admitting: Internal Medicine

## 2021-05-21 ENCOUNTER — Other Ambulatory Visit: Payer: Self-pay

## 2021-05-21 ENCOUNTER — Ambulatory Visit (INDEPENDENT_AMBULATORY_CARE_PROVIDER_SITE_OTHER): Payer: Medicare Other

## 2021-05-21 VITALS — BP 126/86 | HR 97 | Temp 98.1°F | Wt 172.0 lb

## 2021-05-21 DIAGNOSIS — R519 Headache, unspecified: Secondary | ICD-10-CM

## 2021-05-21 DIAGNOSIS — Z0000 Encounter for general adult medical examination without abnormal findings: Secondary | ICD-10-CM

## 2021-05-21 NOTE — Progress Notes (Signed)
? ?   ? ? ? ? ?Patient Active Problem List  ? Diagnosis Date Noted  ? Headache, acute 05/07/2021  ? 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 Statin myopathy 08/02/2020  ? Adjustment disorder with anxiety 07/28/2020  ? Bunion, left 04/05/2019  ? Adjustment insomnia 11/27/2015  ? 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 Essential hypertension 04/13/2015  ? Family history of colon cancer 04/13/2015  ? History of diverticulitis 04/13/2015  ? ? ?Patient's Medications  ?New Prescriptions  ? No medications on file  ?Previous Medications  ? AMLODIPINE (NORVASC) 10 MG TABLET    Take 1 tablet (10 mg total) by mouth daily.  ? CHOLECALCIFEROL (VITAMIN D3 PO)    Vitamin D3  ? MULTIPLE VITAMIN (MULTIVITAMIN ADULT PO)    Take 1 tablet by mouth once a week.  ? MULTIPLE VITAMINS-MINERALS (OCUVITE EXTRA) TABS    Take 1 tablet by mouth once a week.  ? PHENTERMINE (ADIPEX-P) 37.5 MG TABLET    phentermine 37.5 mg tablet ? Take 1 tablet every day by oral route for 30 days.  ? VITAMIN D PO    Take 1 tablet by mouth daily.  ?Modified Medications  ? No medications on file  ?Discontinued Medications  ? No medications on file  ? ? ?Subjective: ?8 YF with PMHx as below presents for hospital follow-up of possible listeria CNS infection. She was admitted to [facility]6-3[facility]8 with possible listeria meningitis. Pt reported she had eaten shrimp that may  have been contaminated with listeria monocytogenes. She received notification via email that there was a recall on cocktail shrimp by [facility]. Note is in media tab. She stated, initially she had malaise followed by photophobia, brain fog and headache. LP showed 1wbc, 50glc, 64PTN, CSF Cx NG x 2 days.. She was initially started on ampicillin. Since CSF Cx were negative and pt's symptoms resolved she was discharged and ampicillin discontinued . Appointment with ID scheduled to follow-up with meningitis panel(Returned negative). ?Today 05/21/21: Pt reports feeling well. She denies HA,  photophobia, N/VD.  ? ?Review of Systems: ?Review of Systems  ?All other systems reviewed and are negative. ? ?Past Medical History:  ?Diagnosis Date  ? Anxiety   ? Colon polyp   ? Depression   ? Diverticulitis   ? Diverticulosis   ? Hyperlipidemia   ? Hypertension   ? Panic attack as reaction to stress 11/27/2015  ? ? ?Social History  ? ?Tobacco Use  ? Smoking status: Former  ?  Types: Cigarettes  ? Smokeless tobacco: Never  ?Vaping Use  ? Vaping Use: Never used  ?Substance Use Topics  ? Alcohol use: Yes  ?  Comment: Pt quit last drink was 2 months ago  ? Drug use: No  ? ? ?Family History  ?Problem Relation Age of Onset  ? Alzheimer's disease Mother   ? Breast cancer Mother 22  ? Hypertension Mother   ? Thyroid disease Mother   ? Alcohol abuse Father   ? Colon cancer Father   ? Arthritis Brother   ? Hypertension Brother   ? Breast cancer Maternal Aunt 4  ? Breast cancer Maternal Grandmother 90  ? Diabetes Neg Hx   ? ? ?Allergies  ?Allergen Reactions  ? Atorvastatin Other (See Comments)  ?  Severe myalgias  ? ? ?Health Maintenance  ?Topic Date Due  ? DEXA SCAN  03/04/2021  ? Pneumonia Vaccine 38+ Years old (2 - PCV) 07/28/2021  ? COVID-19 Vaccine (4 - Booster  for [place] series) 05/26/2021 (Originally 02/16/2020)  ? COLONOSCOPY (Pts 45-55yrs Insurance coverage will need to be confirmed)  02/26/2022  ? MAMMOGRAM  05/16/2022  ? TETANUS/TDAP  05/25/2025  ? INFLUENZA VACCINE  Completed  ? Hepatitis C Screening  Completed  ? Zoster Vaccines- Shingrix  Completed  ? HPV VACCINES  Aged Out  ? ? ?Objective: ? ?There were no vitals filed for this visit. ?There is no height or weight on file to calculate BMI. ? ?Physical Exam ?Constitutional:   ?   Appearance: Normal appearance.  ?HENT:  ?   Head: Normocephalic and atraumatic.  ?   Right Ear: Tympanic membrane normal.  ?   Left Ear: Tympanic membrane normal.  ?   Nose: Nose normal.  ?   Mouth/Throat:  ?   Mouth: Mucous membranes are moist.  ?Eyes:  ?   Extraocular Movements:  Extraocular movements intact.  ?   Conjunctiva/sclera: Conjunctivae normal.  ?   Pupils: Pupils are equal, round, and reactive to light.  ?Cardiovascular:  ?   Rate and Rhythm: Normal rate and regular rhythm.  ?   Heart sounds: No murmur heard. ?  No friction rub. No gallop.  ?Pulmonary:  ?   Effort: Pulmonary effort is normal.  ?   Breath sounds: Normal breath sounds.  ?Abdominal:  ?   General: Abdomen is flat.  ?   Palpations: Abdomen is soft.  ?Musculoskeletal:     ?   General: Normal range of motion.  ?Skin: ?   General: Skin is warm and dry.  ?Neurological:  ?   General: No focal deficit present.  ?   Mental Status: She is alert and oriented to person, place, and time.  ?Psychiatric:     ?   Mood and Affect: Mood normal.  ? ? ?Lab Results ?Lab Results  ?Component Value Date  ? WBC 6.6 05/08/2021  ? HGB 12.7 05/08/2021  ? HCT 38.7 05/08/2021  ? MCV 91.7 05/08/2021  ? PLT 310 05/08/2021  ?  ?Lab Results  ?Component Value Date  ? CREATININE 0.67 05/08/2021  ? BUN 14 05/08/2021  ? NA 139 05/08/2021  ? K 4.1 05/08/2021  ? CL 108 05/08/2021  ? CO2 23 05/08/2021  ?  ?Lab Results  ?Component Value Date  ? ALT 48 (H) 05/08/2021  ? AST 24 05/08/2021  ? ALKPHOS 82 05/08/2021  ? BILITOT 0.4 05/08/2021  ?  ?Lab Results  ?Component Value Date  ? CHOL 274 (H) 07/28/2020  ? HDL 72.00 07/28/2020  ? [place] 171 (H) 07/28/2020  ? TRIG 159.0 (H) 07/28/2020  ? CHOLHDL 4 07/28/2020  ? ?No results found for: LABRPR, RPRTITER ?No results found for: HIV1RNAQUANT, HIV1RNAVL, CD4TABS ?  ?#HA and photophobia suspected 2/2 migraine ?-Pt has a remote Hx of migraines ?-CSF Cx and meningitis panel negative, slight increase in CSF protein can be attributed to migraines. If it was viral meningitis by organism not on ME panel, her symptoms have since resolved.  ?-Follow-up PRN ? ? ? ?Laurice Record, MD ?[facility] for Infectious Disease ?[facility] ?05/21/2021, 1:26 PM  ?

## 2021-05-21 NOTE — Progress Notes (Addendum)
Virtual Visit via Telephone Note ? ?I connected with  Ashley Duffy on 05/21/21 at 11:00 AM EDT by telephone and verified that I am speaking with the correct person using two identifiers. ? ?Medicare Annual Wellness visit completed telephonically due to Covid-19 pandemic.  ? ?Persons participating in this call: This Health Coach and this patient.  ? ?Location: ?Patient: Home ?Provider: Office  ?  ?I discussed the limitations, risks, security and privacy concerns of performing an evaluation and management service by telephone and the availability of in person appointments. The patient expressed understanding and agreed to proceed. ? ?Unable to perform video visit due to video visit attempted and failed and/or patient does not have video capability.  ? ?Some vital signs may be absent or patient reported.  ? ?Ashley Brace, LPN ? ? ?Subjective:  ? Ashley Duffy is a 66 y.o. female who presents for an Initial Medicare Annual Wellness Visit. ? ?Review of Systems    ? ?Cardiac Risk Factors include: advanced age (>79men, >97 women);hypertension ? ?   ?Objective:  ?  ?There were no vitals filed for this visit. ?There is no height or weight on file to calculate BMI. ? ?Advanced Directives 05/21/2021 05/07/2021 05/07/2021  ?Does Patient Have a Medical Advance Directive? Yes No No  ?Type of Advance Directive Healthcare Power of Attorney - -  ?Copy of [place] in Chart? No - copy requested - -  ?Would patient like information on creating a medical advance directive? - No - Patient declined No - Patient declined  ? ? ?Current Medications (verified) ?Outpatient Encounter Medications as of 05/21/2021  ?Medication Sig  ? amLODipine (NORVASC) 10 MG tablet Take 1 tablet (10 mg total) by mouth daily.  ? Cholecalciferol (VITAMIN D3 PO) Vitamin D3  ? Multiple Vitamin (MULTIVITAMIN ADULT PO) Take 1 tablet by mouth once a week.  ? Multiple Vitamins-Minerals (OCUVITE EXTRA) TABS Take 1 tablet by mouth once a week.  ?  phentermine (ADIPEX-P) 37.5 MG tablet phentermine 37.5 mg tablet ? Take 1 tablet every day by oral route for 30 days.  ? VITAMIN D PO Take 1 tablet by mouth daily.  ? ?No facility-administered encounter medications on file as of 05/21/2021.  ? ? ?Allergies (verified) ?Atorvastatin  ? ?History: ?Past Medical History:  ?Diagnosis Date  ? Anxiety   ? Colon polyp   ? Depression   ? Diverticulitis   ? Diverticulosis   ? Hyperlipidemia   ? Hypertension   ? Panic attack as reaction to stress 11/27/2015  ? ?Past Surgical History:  ?Procedure Laterality Date  ? APPENDECTOMY    ? BREAST BIOPSY Left over 10 yrs ago  ? benign  ? COLONOSCOPY  2007  ? [place] SURGERY  1998  ? ?Family History  ?Problem Relation Age of Onset  ? Alzheimer's disease Mother   ? Breast cancer Mother 67  ? Hypertension Mother   ? Thyroid disease Mother   ? Alcohol abuse Father   ? Colon cancer Father   ? Arthritis Brother   ? Hypertension Brother   ? Breast cancer Maternal Aunt 24  ? Breast cancer Maternal Grandmother 18  ? Diabetes Neg Hx   ? ?Social History  ? ?Socioeconomic History  ? Marital status: Widowed  ?  Spouse name: Not on file  ? Number of children: Not on file  ? Years of education: Not on file  ? Highest education level: Not on file  ?Occupational History  ? Not on  file  ?Tobacco Use  ? Smoking status: Former  ?  Types: Cigarettes  ? Smokeless tobacco: Never  ?Vaping Use  ? Vaping Use: Never used  ?Substance and Sexual Activity  ? Alcohol use: Yes  ?  Comment: Pt quit last drink was 2 months ago  ? Drug use: No  ? Sexual activity: Never  ?Other Topics Concern  ? Not on file  ?Social History Narrative  ? Not on file  ? ?Social Determinants of Health  ? ?Financial Resource Strain: Low Risk   ? Difficulty of Paying Living Expenses: Not hard at all  ?Food Insecurity: No Food Insecurity  ? Worried About Charity fundraiser in the Last Year: Never true  ? Ran Out of Food in the Last Year: Never true  ?Transportation Needs: No Transportation  Needs  ? Lack of Transportation (Medical): No  ? Lack of Transportation (Non-Medical): No  ?Physical Activity: Unknown  ? Days of Exercise per Week: Not on file  ? Minutes of Exercise per Session: 40 min  ?Stress: No Stress Concern Present  ? Feeling of Stress : Not at all  ?Social Connections: Moderately Isolated  ? Frequency of Communication with Friends and Family: More than three times a week  ? Frequency of Social Gatherings with Friends and Family: More than three times a week  ? Attends Religious Services: Never  ? Active Member of Clubs or Organizations: Yes  ? Attends Archivist Meetings: 1 to 4 times per year  ? Marital Status: Widowed  ? ? ?Tobacco Counseling ?Counseling given: Not Answered ? ? ?Clinical Intake: ? ?Pre-visit preparation completed: Yes ? ?Pain : No/denies pain ? ?  ? ?BMI - recorded: 28.99 ?Nutritional Risks: None ?Diabetes: No ? ?How often do you need to have someone help you when you read instructions, pamphlets, or other written materials from your doctor or pharmacy?: 1 - Never ? ?Diabetic?no ? ?Interpreter Needed?: No ? ?Information entered by :: Charlott Rakes, LPN ? ? ?Activities of Daily Living ?In your present state of health, do you have any difficulty performing the following activities: 05/21/2021 05/07/2021  ?Hearing? N N  ?Vision? N N  ?Difficulty concentrating or making decisions? N N  ?Walking or climbing stairs? N N  ?Dressing or bathing? N N  ?Doing errands, shopping? N N  ?Preparing Food and eating ? N -  ?Using the Toilet? N -  ?In the past six months, have you accidently leaked urine? N -  ?Do you have problems with loss of bowel control? N -  ?Managing your Medications? N -  ?Managing your Finances? N -  ?Housekeeping or managing your Housekeeping? N -  ?Some recent data might be hidden  ? ? ?Patient Care Team: ?Leamon Arnt, MD as PCP - General (Family Medicine) ?[place], [facility] as Set designer (Gynecology) ?Rosana Berger, MD  as Consulting Physician (Gastroenterology) ? ?Indicate any recent Medical Services you may have received from other than [facility] providers in the past year (date may be approximate). ? ?   ?Assessment:  ? This is a routine wellness examination for Ashley Duffy. ? ?Hearing/Vision screen ?Hearing Screening - Comments:: Pt stated slight loss  ?Vision Screening - Comments:: Encouraged to follow up with eye provider ? ?Dietary issues and exercise activities discussed: ?Current Exercise Habits: Home exercise routine, Type of exercise: walking, Time (Minutes): 40, Frequency (Times/Week): 4, Weekly Exercise (Minutes/Week): 160 ? ? Goals Addressed   ? ?  ?  ?  ?  ?  This Visit's Progress  ?  Patient Stated     ?  Continue to walk for exercise and get back to the gym ?  ? ?  ? ?Depression Screen ?PHQ 2/9 Scores 05/21/2021 10/31/2020 07/28/2020 07/28/2019 07/24/2018 07/18/2017 02/14/2017  ?PHQ - 2 Score 0 0 0 0 0 0 1  ?PHQ- 9 Score - - - - - 0 5  ?  ?Fall Risk ?Fall Risk  05/21/2021 10/31/2020 07/28/2020 07/24/2018  ?Falls in the past year? 0 0 0 0  ?Number falls in past yr: 0 0 0 0  ?Injury with Fall? 0 0 0 0  ?Risk for fall due to : Impaired vision - - -  ?Follow up Falls prevention discussed - - Falls evaluation completed  ? ? ?FALL RISK PREVENTION PERTAINING TO THE HOME: ? ?Any stairs in or around the home? Yes  ?If so, are there any without handrails? No  ?Home free of loose throw rugs in walkways, pet beds, electrical cords, etc? Yes  ?Adequate lighting in your home to reduce risk of falls? Yes  ? ?ASSISTIVE DEVICES UTILIZED TO PREVENT FALLS: ? ?Life alert? Apple watch  ?Use of a cane, walker or w/c? No  ?Grab bars in the bathroom? No  ?Shower chair or bench in shower? No  ?Elevated toilet seat or a handicapped toilet? No  ? ?TIMED UP AND GO: ? ?Was the test performed? No .  ?Cognitive Function: ?  ?  ?6CIT Screen 05/21/2021  ?What Year? 0 points  ?What month? 0 points  ?What time? 0 points  ?Count back from 20 0 points  ?Months in reverse  0 points  ?Repeat phrase 0 points  ?Total Score 0  ? ? ?Immunizations ?Immunization History  ?Administered Date(s) Administered  ? Influenza, Seasonal, Injecte, Preservative Fre 01/12/2003, 11/27/2015  ?

## 2021-07-31 ENCOUNTER — Ambulatory Visit (INDEPENDENT_AMBULATORY_CARE_PROVIDER_SITE_OTHER): Payer: Medicare Other | Admitting: Family Medicine

## 2021-07-31 ENCOUNTER — Encounter: Payer: Self-pay | Admitting: Family Medicine

## 2021-07-31 VITALS — BP 130/70 | HR 70 | Temp 97.8°F | Ht 65.0 in | Wt 166.5 lb

## 2021-07-31 DIAGNOSIS — E782 Mixed hyperlipidemia: Secondary | ICD-10-CM | POA: Diagnosis not present

## 2021-07-31 DIAGNOSIS — F5102 Adjustment insomnia: Secondary | ICD-10-CM | POA: Diagnosis not present

## 2021-07-31 DIAGNOSIS — I10 Essential (primary) hypertension: Secondary | ICD-10-CM | POA: Diagnosis not present

## 2021-07-31 DIAGNOSIS — Z23 Encounter for immunization: Secondary | ICD-10-CM | POA: Diagnosis not present

## 2021-07-31 DIAGNOSIS — R252 Cramp and spasm: Secondary | ICD-10-CM

## 2021-07-31 DIAGNOSIS — Z0000 Encounter for general adult medical examination without abnormal findings: Secondary | ICD-10-CM | POA: Diagnosis not present

## 2021-07-31 DIAGNOSIS — Z800 Family history of malignant neoplasm of digestive organs: Secondary | ICD-10-CM

## 2021-07-31 DIAGNOSIS — T466X5A Adverse effect of antihyperlipidemic and antiarteriosclerotic drugs, initial encounter: Secondary | ICD-10-CM

## 2021-07-31 DIAGNOSIS — G720 Drug-induced myopathy: Secondary | ICD-10-CM | POA: Diagnosis not present

## 2021-07-31 LAB — CBC WITH DIFFERENTIAL/PLATELET
Basophils Absolute: 0 10*3/uL (ref 0.0–0.1)
Basophils Relative: 0.3 % (ref 0.0–3.0)
Eosinophils Absolute: 0.1 10*3/uL (ref 0.0–0.7)
Eosinophils Relative: 1.6 % (ref 0.0–5.0)
HCT: 42.2 % (ref 36.0–46.0)
Hemoglobin: 13.9 g/dL (ref 12.0–15.0)
Lymphocytes Relative: 25.9 % (ref 12.0–46.0)
Lymphs Abs: 2 10*3/uL (ref 0.7–4.0)
MCHC: 33 g/dL (ref 30.0–36.0)
MCV: 90.1 fl (ref 78.0–100.0)
Monocytes Absolute: 0.7 10*3/uL (ref 0.1–1.0)
Monocytes Relative: 8.6 % (ref 3.0–12.0)
Neutro Abs: 4.8 10*3/uL (ref 1.4–7.7)
Neutrophils Relative %: 63.6 % (ref 43.0–77.0)
Platelets: 360 10*3/uL (ref 150.0–400.0)
RBC: 4.69 Mil/uL (ref 3.87–5.11)
RDW: 14.5 % (ref 11.5–15.5)
WBC: 7.6 10*3/uL (ref 4.0–10.5)

## 2021-07-31 LAB — COMPREHENSIVE METABOLIC PANEL
ALT: 39 U/L — ABNORMAL HIGH (ref 0–35)
AST: 26 U/L (ref 0–37)
Albumin: 4.6 g/dL (ref 3.5–5.2)
Alkaline Phosphatase: 84 U/L (ref 39–117)
BUN: 15 mg/dL (ref 6–23)
CO2: 24 mEq/L (ref 19–32)
Calcium: 10.5 mg/dL (ref 8.4–10.5)
Chloride: 106 mEq/L (ref 96–112)
Creatinine, Ser: 0.78 mg/dL (ref 0.40–1.20)
GFR: 79.25 mL/min (ref >60.00)
Glucose, Bld: 95 mg/dL (ref 70–99)
Potassium: 4 mEq/L (ref 3.5–5.1)
Sodium: 139 mEq/L (ref 135–145)
Total Bilirubin: 0.5 mg/dL (ref 0.2–1.2)
Total Protein: 7.1 g/dL (ref 6.0–8.3)

## 2021-07-31 LAB — LIPID PANEL
Cholesterol: 272 mg/dL — ABNORMAL HIGH (ref 0–200)
HDL: 79.6 mg/dL (ref >39.00)
LDL Cholesterol: 168 mg/dL — ABNORMAL HIGH (ref 0–99)
NonHDL: 192.76
Total CHOL/HDL Ratio: 3
Triglycerides: 122 mg/dL (ref 0.0–149.0)
VLDL: 24.4 mg/dL (ref 0.0–40.0)

## 2021-07-31 LAB — TSH: TSH: 2.18 u[IU]/mL (ref 0.35–5.50)

## 2021-07-31 MED ORDER — AMLODIPINE BESYLATE 10 MG PO TABS: 10.0000 mg | ORAL_TABLET | Freq: Every day | ORAL | 3 refills | Status: DC

## 2021-07-31 NOTE — Patient Instructions (Addendum)
Please return in 6 months for hypertension follow up.   I will release your lab results to you on your MyChart account with further instructions. You may see the results before I do, but when I review them I will send you a message with my report or have my assistant call you if things need to be discussed. Please reply to my message with any questions. Thank you!   Today you were given your Prevnar 20 vaccination. This protects from pneumonia infections  If you have any questions or concerns, please don't hesitate to send me a message via MyChart or call the office at [phone]. Thank you for visiting with [place] today! It's our pleasure caring for you.   Leg Cramps Leg cramps occur when one or more muscles tighten and a person has no control over it (involuntary muscle contraction). Muscle cramps are most common in the calf muscles of the leg. They can occur during exercise or at rest. Leg cramps are painful, and they may last for a few seconds to a few minutes. Cramps may return several times before they finally stop. Usually, leg cramps are not caused by a serious medical problem. In many cases, the cause is not known. Some common causes include: Excessive physical effort (overexertion), such as during intense exercise. Doing the same motion over and over. Staying in a certain position for a long period of time. Improper preparation, form, or technique while doing a sport or an activity. Dehydration. Injury. Side effects of certain medicines. Abnormally low levels of minerals in your blood (electrolytes), especially potassium and calcium. This could result from: Pregnancy. Taking diuretic medicines. Follow these instructions at home: Eating and drinking Drink enough fluid to keep your urine pale yellow. Staying hydrated may help prevent cramps. Eat a healthy diet that includes plenty of nutrients to help your muscles function. A healthy diet includes fruits and vegetables, lean protein,  whole grains, and low-fat or nonfat dairy products. Managing pain, stiffness, and swelling     Try massaging, stretching, and relaxing the affected muscle. Do this for several minutes at a time. If directed, put ice on areas that are sore or painful after a cramp. To do this: Put ice in a plastic bag. Place a towel between your skin and the bag. Leave the ice on for 20 minutes, 2-3 times a day. Remove the ice if your skin turns bright red. This is very important. If you cannot feel pain, heat, or cold, you have a greater risk of damage to the area. If directed, apply heat to muscles that are tense or tight. Do this before you exercise, or as often as told by your health care provider. Use the heat source that your health care provider recommends, such as a moist heat pack or a heating pad. To do this: Place a towel between your skin and the heat source. Leave the heat on for 20-30 minutes. Remove the heat if your skin turns bright red. This is especially important if you are unable to feel pain, heat, or cold. You may have a greater risk of getting burned. Try taking hot showers or baths to help relax tight muscles. General instructions If you are having frequent leg cramps, avoid intense exercise for several days. Take over-the-counter and prescription medicines only as told by your health care provider. Keep all follow-up visits. This is important. Contact a health care provider if: Your leg cramps get more severe or more frequent, or they do not improve over  time. Your foot becomes cold, numb, or blue. Summary Muscle cramps can develop in any muscle, but the most common place is in the calf muscles of the leg. Leg cramps are painful, and they may last for a few seconds to a few minutes. Usually, leg cramps are not caused by a serious medical problem. Often, the cause is not known. Stay hydrated, and take over-the-counter and prescription medicines only as told by your health care  provider. This information is not intended to replace advice given to you by your health care provider. Make sure you discuss any questions you have with your health care provider. Document Revised: 07/07/2019 Document Reviewed: 07/07/2019 Elsevier Patient Education  [place].

## 2021-07-31 NOTE — Progress Notes (Signed)
Subjective  Chief Complaint  Patient presents with   Annual Exam    Pt is here for Annual Exam and is currently fasting. Pt stated that she has been experiencing charlie horses in both legs for the past 45mos...    HPI: Ashley Duffy is a 66 y.o. female who presents to [facility] [place] today for a Female Wellness Visit. She also has the concerns and/or needs as listed above in the chief complaint. These will be addressed in addition to the Health Maintenance Visit.   Wellness Visit: annual visit with health maintenance review and exam without Pap  Health maintenance: Screens are current.  Sees GYN.  Reports normal bone density or mild osteopenia.  We will get records.  Colonoscopy every 5 years and are current.  Overall feels well.  Working hard in the garden.  No mood, sleep problems.  No falls.  Eligible for Prevnar 20 today.  Other immunizations are current. Chronic disease f/u and/or acute problem visit: (deemed necessary to be done in addition to the wellness visit): Hypertension: Feeling well. Taking medications w/o adverse effects. No symptoms of CHF, angina; no palpitations, sob, cp or lower extremity edema. Compliant with meds.  Takes amlodipine 10 mg daily.  Tolerates well. Hyperlipidemia with statin myopathy: She has been eating well.  Trying to exercise.  Fasting for recheck.  Would be a Zetia candidate if needed Adjustment insomnia: Feels this is well controlled.  Behavioral management. Complains of calf muscle cramps.  Occurring more frequently.  Has been working in the yard for long hours.  Thinks this could be contributing.  Assessment  1. Annual physical exam   2. Essential hypertension   3. Family history of colon cancer   4. Adjustment insomnia   5. Statin myopathy   6. Mixed hyperlipidemia   7. Need for pneumococcal 20-valent conjugate vaccination   8. Muscle cramps      Plan  Female Wellness Visit: Age appropriate Health Maintenance and  Prevention measures were discussed with patient. Included topics are cancer screening recommendations, ways to keep healthy (see AVS) including dietary and exercise recommendations, regular eye and dental care, use of seat belts, and avoidance of moderate alcohol use and tobacco use.  BMI: discussed patient's BMI and encouraged positive lifestyle modifications to help get to or maintain a target BMI. HM needs and immunizations were addressed and ordered. See below for orders. See HM and immunization section for updates.  Prevnar 20 given today Routine labs and screening tests ordered including cmp, cbc and lipids where appropriate. Discussed recommendations regarding Vit D and calcium supplementation (see AVS)  Chronic disease management visit and/or acute problem visit: Hypertension is well controlled amlodipine 10 mg daily.  Recheck renal function electrolytes.  No adverse effects.  Refilled Hyperlipidemia: Discussed diet.  Discussed low-fat diet.  Recheck fasting lipids.  If LDL remains elevated, recommend Zetia.  Patient agrees Adjustment insomnia: Doing well now.  Stable Leg cramps: Discussed hydration, stretching, tonic water.  See after visit summary.  Follow up: 6 months to recheck blood pressure. Orders Placed This Encounter  Procedures   Pneumococcal conjugate vaccine 20-valent (Prevnar 20)   CBC with Differential/Platelet   Comprehensive metabolic panel   Lipid panel   TSH   Meds ordered this encounter  Medications   amLODipine (NORVASC) 10 MG tablet    Sig: Take 1 tablet (10 mg total) by mouth daily.    Dispense:  90 tablet    Refill:  3  Body mass index is 27.71 kg/m. 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 04/16/2015    Priority: Low   History of diverticulitis 04/13/2015    Priority: Low   Mixed hyperlipidemia 07/31/2021   Statin myopathy 08/02/2020    Atorvastatin, lovastatin     Adjustment disorder with anxiety 07/28/2020   Health Maintenance  Topic Date Due   COVID-19 Vaccine (4 - Booster for Pfizer series) 02/16/2020   INFLUENZA VACCINE  10/02/2021   COLONOSCOPY (Pts [id number] Insurance coverage will need to be confirmed)  02/26/2022   MAMMOGRAM  05/16/2022   DEXA SCAN  07/03/2023   TETANUS/TDAP  05/25/2025   Pneumonia Vaccine 3365+ Years old  Completed   Hepatitis C Screening  Completed   Zoster Vaccines- Shingrix  Completed   HPV VACCINES  Aged Out   Immunization History  Administered Date(s) Administered   Influenza, Seasonal, Injecte, Preservative Fre 01/12/2003, 11/27/2015   Influenza,inj,Quad PF,6+ Mos 02/14/2017, 01/23/2018, 12/17/2018   Influenza-Unspecified 12/18/2020   PFIZER(Purple Top)SARS-COV-2 Vaccination 05/19/2019, 06/09/2019, 12/22/2019   PNEUMOCOCCAL CONJUGATE-20 07/31/2021   Pneumococcal Polysaccharide-23 08/06/2012, 07/28/2020   Tdap 05/26/2015   Zoster Recombinat (Shingrix) 01/23/2018, 07/28/2020   Zoster, Live 05/26/2015   We updated and reviewed the patient's past history in detail and it is documented below. Allergies: Patient is allergic to atorvastatin. 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 Past Surgical History Patient  has a past surgical history that includes Lumbar disc surgery (1998); Appendectomy; Colonoscopy (2007); and Breast biopsy (Left, over 10 yrs  ago). Family History: Patient family history includes Alcohol abuse in her father; Alzheimer's disease in her mother; Arthritis in her brother; Breast cancer (age of onset: 6135) in her mother; Breast cancer (age of onset: 8450) in her maternal grandmother; Breast cancer (age of onset: 6355) in her maternal aunt; Colon cancer in her father; Hypertension in her brother and mother; Thyroid disease in her mother. Social History:  Patient  reports that she has quit smoking. Her smoking use included cigarettes. She has never used smokeless tobacco. She reports current alcohol use. She reports that she does not use drugs.  Review of Systems: Constitutional: negative for fever or malaise Ophthalmic: negative for photophobia, double vision or loss of vision Cardiovascular: negative for chest pain, dyspnea on exertion, or new LE swelling Respiratory: negative for SOB or persistent cough Gastrointestinal: negative for abdominal pain, change in bowel habits or melena Genitourinary: negative for dysuria or gross hematuria, no abnormal uterine bleeding or disharge Musculoskeletal: negative for new gait disturbance or muscular weakness Integumentary: negative for new or persistent rashes, no breast lumps Neurological: negative for TIA or stroke symptoms Psychiatric: negative for SI or delusions Allergic/Immunologic: negative for hives  Patient Care Team    Relationship Specialty Notifications Start End  Willow OraAndy, Adriann Thau L, MD PCP -  General Family Medicine  02/14/17   Ginette Otto, [facility] Consulting Physician Gynecology  02/14/17    Comment: Dr. Darleen Crocker, Kristine Garbe, MD Consulting Physician Gastroenterology  04/05/19     Objective  Vitals: BP 130/70   Pulse 70   Temp 97.8 F (36.6 C)   Ht 5\' 5"  (1.651 m)   Wt 166 lb 8 oz (75.5 kg)   SpO2 96%   BMI 27.71 kg/m  General:  Well developed, well nourished, no acute distress  Psych:  Alert and orientedx3,normal mood and affect HEENT:   Normocephalic, atraumatic, non-icteric sclera,  supple neck without adenopathy, mass or thyromegaly Cardiovascular:  Normal S1, S2, RRR without gallop, rub or murmur Respiratory:  Good breath sounds bilaterally, CTAB with normal respiratory effort Gastrointestinal: normal bowel sounds, soft, non-tender, no noted masses. No HSM MSK: no deformities, contusions. Joints are without erythema or swelling.  Skin:  Warm, no rashes or suspicious lesions noted Neurologic:    Mental status is normal. Gross motor and sensory exams are normal. Normal gait. No tremor   Commons side effects, risks, benefits, and alternatives for medications and treatment plan prescribed today were discussed, and the patient expressed understanding of the given instructions. Patient is instructed to call or message via MyChart if he/she has any questions or concerns regarding our treatment plan. No barriers to understanding were identified. We discussed Red Flag symptoms and signs in detail. Patient expressed understanding regarding what to do in case of urgent or emergency type symptoms.  Medication list was reconciled, printed and provided to the patient in AVS. Patient instructions and summary information was reviewed with the patient as documented in the AVS. This note was prepared with assistance of Dragon voice recognition software. Occasional wrong-word or sound-a-like substitutions may have occurred due to the inherent limitations of voice recognition software  This visit occurred during the SARS-CoV-2 public health emergency.  Safety protocols were in place, including screening questions prior to the visit, additional usage of staff PPE, and extensive cleaning of exam room while observing appropriate contact time as indicated for disinfecting solutions.

## 2021-08-01 MED ORDER — EZETIMIBE 10 MG PO TABS: 10.0000 mg | ORAL_TABLET | Freq: Every day | ORAL | 3 refills | Status: DC

## 2021-08-01 NOTE — Addendum Note (Signed)
Addended by: Billey Chang on: 08/01/2021 04:41 PM   Modules accepted: Orders

## 2021-10-03 ENCOUNTER — Encounter: Payer: Self-pay | Admitting: Family

## 2021-10-03 ENCOUNTER — Ambulatory Visit (INDEPENDENT_AMBULATORY_CARE_PROVIDER_SITE_OTHER): Payer: Medicare Other | Admitting: Family

## 2021-10-03 VITALS — BP 108/70 | HR 62 | Temp 97.4°F | Ht 65.0 in | Wt 163.0 lb

## 2021-10-03 DIAGNOSIS — F419 Anxiety disorder, unspecified: Secondary | ICD-10-CM | POA: Insufficient documentation

## 2021-10-03 DIAGNOSIS — N898 Other specified noninflammatory disorders of vagina: Secondary | ICD-10-CM | POA: Diagnosis not present

## 2021-10-03 LAB — POCT URINALYSIS DIPSTICK
Bilirubin, UA: NEGATIVE
Blood, UA: NEGATIVE
Glucose, UA: NEGATIVE
Ketones, UA: NEGATIVE
Leukocytes, UA: NEGATIVE
Nitrite, UA: NEGATIVE
Protein, UA: NEGATIVE
Spec Grav, UA: >=1.03 — AB (ref 1.010–1.025)
Urobilinogen, UA: 0.2 E.U./dL
pH, UA: 6 (ref 5.0–8.0)

## 2021-10-03 NOTE — Progress Notes (Signed)
Patient ID: Ashley Duffy, female    DOB: June 09, 1955, 66 y.o.   MRN: [id number]  Chief Complaint  Patient presents with   Vaginal Discharge    Pt c/o vaginal discharge and odor for a while.     HPI: Vaginal odor: Patient c/o a small tender bump on inside of vagina, first noticed a week ago, has not grown, denies any recent sexual activity, denies any vaginal discharge, itching, irritation, dysuria, pelvic or flank pain, no hematuria. Just has a strong odor, unsure if urine or from vagina.   Assessment & Plan:  1. Vaginal odor has a small bump, but did not want me to examine as she states "it is nothing". UA neg. reports dark urine, advised on increasing her water intake, does not drink as much as she thought, advised on 2L per day. If itching or irritation develops, advised to call back & I will send in Diflucan, does not need visit.  - POCT Urinalysis Dipstick   Subjective:    Outpatient Medications Prior to Visit  Medication Sig Dispense Refill   amLODipine (NORVASC) 10 MG tablet Take 1 tablet (10 mg total) by mouth daily. 90 tablet 3   Cholecalciferol (VITAMIN D3 PO) Vitamin D3     Multiple Vitamin (MULTIVITAMIN ADULT PO) Take 1 tablet by mouth once a week.     Multiple Vitamins-Minerals (OCUVITE EXTRA) TABS Take 1 tablet by mouth once a week.     ezetimibe (ZETIA) 10 MG tablet Take 1 tablet (10 mg total) by mouth daily. (Patient not taking: Reported on 10/03/2021) 90 tablet 3   phentermine (ADIPEX-P) 37.5 MG tablet phentermine 37.5 mg tablet  Take 1 tablet every day by oral route for 30 days. (Patient not taking: Reported on 10/03/2021)     VITAMIN D PO Take 1 tablet by mouth daily. (Patient not taking: Reported on 10/03/2021)     No facility-administered medications prior to visit.   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  Constitutional:      Appearance: Normal appearance.  Cardiovascular:     Rate and Rhythm: Normal rate and regular rhythm.  Pulmonary:     Effort: Pulmonary effort is normal.     Breath sounds: Normal breath sounds.  Musculoskeletal:        General: Normal range of motion.  Skin:    General: Skin is warm and dry.  Neurological:     Mental Status: She is alert.  Psychiatric:        Mood and Affect: Mood normal.        Behavior: Behavior normal.    BP 108/70 (BP Location: Left Arm, Patient Position: Sitting, Cuff Size: Large)   Pulse 62   Temp (!) 97.4 F (36.3 C) (Temporal)   Ht 5\' 5"  (1.651 m)   Wt 163 lb (73.9 kg)  SpO2 94%   BMI 27.12 kg/m  Wt Readings from Last 3 Encounters:  10/03/21 163 lb (73.9 kg)  07/31/21 166 lb 8 oz (75.5 kg)  05/21/21 172 lb (78 kg)       Dulce Sellar, NP

## 2021-11-26 ENCOUNTER — Encounter: Payer: Self-pay | Admitting: *Deleted

## 2022-01-21 DIAGNOSIS — M8588 Other specified disorders of bone density and structure, other site: Secondary | ICD-10-CM | POA: Diagnosis not present

## 2022-01-21 DIAGNOSIS — N958 Other specified menopausal and perimenopausal disorders: Secondary | ICD-10-CM | POA: Diagnosis not present

## 2022-01-21 LAB — HM DEXA SCAN

## 2022-02-08 ENCOUNTER — Ambulatory Visit (INDEPENDENT_AMBULATORY_CARE_PROVIDER_SITE_OTHER): Payer: Medicare Other | Admitting: Family Medicine

## 2022-02-08 ENCOUNTER — Encounter: Payer: Self-pay | Admitting: Family Medicine

## 2022-02-08 VITALS — BP 138/80 | HR 78 | Temp 99.0°F | Ht 65.0 in | Wt 170.0 lb

## 2022-02-08 DIAGNOSIS — G720 Drug-induced myopathy: Secondary | ICD-10-CM

## 2022-02-08 DIAGNOSIS — M7062 Trochanteric bursitis, left hip: Secondary | ICD-10-CM

## 2022-02-08 DIAGNOSIS — J301 Allergic rhinitis due to pollen: Secondary | ICD-10-CM | POA: Diagnosis not present

## 2022-02-08 DIAGNOSIS — I10 Essential (primary) hypertension: Secondary | ICD-10-CM

## 2022-02-08 DIAGNOSIS — E782 Mixed hyperlipidemia: Secondary | ICD-10-CM | POA: Diagnosis not present

## 2022-02-08 DIAGNOSIS — M25562 Pain in left knee: Secondary | ICD-10-CM

## 2022-02-08 DIAGNOSIS — G8929 Other chronic pain: Secondary | ICD-10-CM

## 2022-02-08 DIAGNOSIS — T466X5A Adverse effect of antihyperlipidemic and antiarteriosclerotic drugs, initial encounter: Secondary | ICD-10-CM

## 2022-02-08 DIAGNOSIS — R053 Chronic cough: Secondary | ICD-10-CM

## 2022-02-08 DIAGNOSIS — E663 Overweight: Secondary | ICD-10-CM

## 2022-02-08 DIAGNOSIS — U099 Post covid-19 condition, unspecified: Secondary | ICD-10-CM

## 2022-02-08 MED ORDER — FLUTICASONE PROPIONATE 50 MCG/ACT NA SUSP: 1.0000 | Freq: Every day | NASAL | 6 refills | Status: DC

## 2022-02-08 MED ORDER — DICLOFENAC SODIUM 1 % EX GEL: 4.0000 g | Freq: Four times a day (QID) | CUTANEOUS | 5 refills | Status: DC | PRN

## 2022-02-08 MED ORDER — PHENTERMINE HCL 37.5 MG PO TABS: 37.5000 mg | ORAL_TABLET | Freq: Every day | ORAL | 2 refills | Status: DC

## 2022-02-08 NOTE — Progress Notes (Signed)
Subjective  CC:  Chief Complaint  Patient presents with   Obesity   Cough    Pt stated that it has been 8 weeks since she had COVID and still has a lingering cough    HPI: Ashley Duffy is a 66 y.o. female who presents to the office today to address the problems listed above in the chief complaint. Hypertension f/u: Control is good . Pt reports she is doing well. taking medications as instructed, no medication side effects noted, no TIAs, no chest pain on exertion, no dyspnea on exertion, no swelling of ankles. Bp is a bit higher today she feels due to weight gain. See below; however at home readings are lower on avg: 120-130/74-80. She denies adverse effects from his BP medications. Compliance with medication is good.  Overweight: since last visit in may, she had worked hard on diet (keto) and exercise and got down to 150 pounds. Has regained weight since being sick with covid: not going to gym, less active and malaise/fatigue and cough. Would like to lose the weight again. Knows how to eat healthy but is feeling hungry all the time now. Has used phentermine successfully in past and feels that would help her get back on track.  Post covid: brain fog is slowly improving, fatigue is slowly improving (had 2 months ago with typical sxs). However, c/o hacking cough persisting although better than it was. Endorses pnd, clear rhinorrhea and sneezing as well. No wheezing or sob HLD and statin intolerant: was on zetia but stopped in 2 mnhts ago because she thought is was causing joint pain: has chronic episodic left knee pain and now intermittent left hip pain. Hurts to lie on that side at times. She associated pain with zetia but upon reflection, the association is less clear. No other myalgias or arthralgias. Knee pain can cause her to limp. She walks for exercise. No low back pain. Has not had knee nor hip evaluated before.   Assessment  1. Essential hypertension   2. Mixed hyperlipidemia   3.  Overweight (BMI 25.0-29.9)   4. Seasonal allergic rhinitis due to pollen   5. Post-COVID chronic cough   6. Greater trochanteric bursitis of left hip   7. Chronic pain of left knee      Plan   Hypertension f/u: BP control is fairly well controlled. Mildly elevated in office but good at home. Suspect weight gain is contributing. Continue amlodipine 10 and monitor.  Overweight: will use phentermine cautiously. Monitor bp. Weight loss will benefit everything. She eats healthy. Phentermine will help with portion control and cut snacking.  Treat for hip bursitis: ice and nsaids.  Suspect knee OA; ice and voltaren gel when flares.  Rec f/u in office for further eval if pain flares again. Pt agrees.  Post covid cough and allergies: flonase and zyrtec. Declines steroid inhaler. Should improve.  HLD: restart zetia and monitor. Low fat diet  I spent a total of 47 minutes for this patient encounter. Time spent included preparation, face-to-face counseling with the patient and coordination of care, review of chart and records, and documentation of the encounter.  Education regarding management of these chronic disease states was given. Management strategies discussed on successive visits include dietary and exercise recommendations, goals of achieving and maintaining IBW, and lifestyle modifications aiming for adequate sleep and minimizing stressors.   Follow up: 6 mo for cpe; sooner for knee or hip pain if needed  No orders of the defined types were placed  in this encounter.  Meds ordered this encounter  Medications   diclofenac Sodium (VOLTAREN) 1 % GEL    Sig: Apply 4 g topically 4 (four) times daily as needed (knee pain).    Dispense:  350 g    Refill:  5   fluticasone (FLONASE) 50 MCG/ACT nasal spray    Sig: Place 1 spray into both nostrils daily.    Dispense:  16 g    Refill:  6   phentermine (ADIPEX-P) 37.5 MG tablet    Sig: Take 1 tablet (37.5 mg total) by mouth daily before  breakfast.    Dispense:  30 tablet    Refill:  2      BP Readings from Last 3 Encounters:  02/08/22 138/80  10/03/21 108/70  07/31/21 130/70   Wt Readings from Last 3 Encounters:  02/08/22 170 lb (77.1 kg)  10/03/21 163 lb (73.9 kg)  07/31/21 166 lb 8 oz (75.5 kg)    Lab Results  Component Value Date   CHOL 272 (H) 07/31/2021   CHOL 274 (H) 07/28/2020   CHOL 279 (H) 07/28/2019   Lab Results  Component Value Date   HDL 79.60 07/31/2021   HDL 72.00 07/28/2020   HDL 78.50 07/28/2019   Lab Results  Component Value Date   LDLCALC 168 (H) 07/31/2021   LDLCALC 171 (H) 07/28/2020   LDLCALC 181 (H) 07/28/2019   Lab Results  Component Value Date   TRIG 122.0 07/31/2021   TRIG 159.0 (H) 07/28/2020   TRIG 97.0 07/28/2019   Lab Results  Component Value Date   CHOLHDL 3 07/31/2021   CHOLHDL 4 07/28/2020   CHOLHDL 4 07/28/2019   No results found for: "LDLDIRECT" Lab Results  Component Value Date   CREATININE 0.78 07/31/2021   BUN 15 07/31/2021   NA 139 07/31/2021   K 4.0 07/31/2021   CL 106 07/31/2021   CO2 24 07/31/2021    The 10-year ASCVD risk score (Arnett DK, et al., 2019) is: 9.7%   Values used to calculate the score:     Age: 87 years     Sex: Female     Is Non-Hispanic African American: No     Diabetic: No     Tobacco smoker: No     Systolic Blood Pressure: 138 mmHg     Is BP treated: Yes     HDL Cholesterol: 79.6 mg/dL     Total Cholesterol: 272 mg/dL  I reviewed the patients updated PMH, FH, and SocHx.    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 Her smoking use included cigarettes. She has never used smokeless tobacco. She reports current alcohol use. She reports that she does not use drugs.  Current Meds  Medication Sig   amLODipine (NORVASC) 10 MG tablet Take 1 tablet (10 mg total) by  mouth daily.   Cholecalciferol (VITAMIN D3 PO) Vitamin D3   diclofenac Sodium (VOLTAREN) 1 % GEL Apply 4 g topically 4 (four) times daily as needed (knee pain).   fluticasone (FLONASE) 50 MCG/ACT nasal spray Place 1 spray into both nostrils daily.   Multiple Vitamin (MULTIVITAMIN ADULT PO) Take 1 tablet by mouth once a week.   Multiple Vitamins-Minerals (OCUVITE EXTRA) TABS Take 1 tablet by mouth once a week.   phentermine (ADIPEX-P) 37.5 MG tablet Take 1 tablet (37.5 mg total) by mouth daily before breakfast.    Review of Systems: Cardiovascular: negative for chest pain, palpitations, leg swelling, orthopnea Respiratory: negative for SOB, wheezing or persistent cough Gastrointestinal: negative for abdominal pain Genitourinary: negative for dysuria or gross hematuria  Objective  Vitals: BP 138/80   Pulse 78   Temp 99 F (37.2 C)   Ht 5\' 5"  (1.651 m)   Wt 170 lb (77.1 kg)   SpO2 96%   BMI 28.29 kg/m  General: no acute distress  Psych:  Alert and oriented, normal mood and affect HEENT:  Normocephalic, atraumatic, supple neck , mild nasal congestion present. Cardiovascular:  RRR without murmur. no edema Respiratory:  Good breath sounds bilaterally, CTAB with normal respiratory effort Skin:  Warm, no rashes Left knee: mild crepitus. No effusion from Mild left gr trochant bursa ttp Nl gail Neurologic:   Mental status is normal Commons side effects, risks, benefits, and alternatives for medications and treatment plan  prescribed today were discussed, and the patient expressed understanding of the given instructions. Patient is instructed to call or message via MyChart if he/she has any questions or concerns regarding our treatment plan. No barriers to understanding were identified. We discussed Red Flag symptoms and signs in detail. Patient expressed understanding regarding what to do in case of urgent or emergency type symptoms.  Medication list was reconciled, printed and provided to the patient in AVS. Patient instructions and summary information was reviewed with the patient as documented in the AVS. This note was prepared with assistance of Dragon voice recognition software. Occasional wrong-word or sound-a-like substitutions may have occurred due to the inherent limitation

## 2022-02-08 NOTE — Patient Instructions (Signed)
Please return in May for your annual complete physical; please come fasting. Sooner if need more help with knee or hip pain.   Use flonase and zyrtec for your allergies Restart the zetia at night  If you have any questions or concerns, please don't hesitate to send me a message via MyChart or call the office at [phone]. Thank you for visiting with [place] today! It's our pleasure caring for you.   Hip Bursitis  Hip bursitis is the swelling of one or more of the fluid-filled sacs (bursae) in the hip joint. The hip bursae absorb shocks and prevent bones from rubbing against each other. If a bursa becomes irritated, it can fill with extra fluid and become inflamed. Hip bursitis can cause mild to moderate pain, and symptoms often come and go over time. What are the causes? This condition results from increased friction between the hip bones and the tendons around the hip joint. This condition can happen if you: Overuse your hip muscles. Injure your hip. Have weak buttocks muscles. Have bone spurs. Have an infection. In some cases, the cause may not be known. What increases the risk? You are more likely to develop this condition if: You injured your hip previously or had hip surgery. You have a medical condition, such as arthritis, gout, diabetes, or thyroid disease. You have spine problems. You have one leg that is shorter than the other. You participate in athletic activities that include repetitive motion, like running. You participate in sports where there is a risk of injury or falling, such as football, martial arts, or skiing. What are the signs or symptoms? Symptoms may come and go, and they often include: Pain in the hip or groin area. Pain may get worse with movement. Tenderness and swelling of the hip. In rare cases, the bursa may become infected. If this happens, you may get a fever, as well as warmth and redness in the hip area. How is this diagnosed? This condition may be  diagnosed based on: Your symptoms. Your medical history. A physical exam. Imaging tests, such as: X-rays to check your bones. MRI or ultrasound to check your tendons and muscles. Bone scan. How is this treated? This condition is treated by resting, icing, applying pressure (compression), and raising (elevating) the injured area. This is called RICE treatment. In some cases, RICE treatment may not be enough to make your symptoms go away. Treatment may also include: Using crutches, a cane, or a walker to decrease the strain on your hip. Taking medicine to help with swelling and pain. Getting a shot of cortisone medicine near the affected area to reduce swelling and pain. Taking antibiotic medicines if there is an infection. Draining fluid out of the bursa to help relieve swelling and pain. Having surgery to remove a damaged or infected bursa. This is rare. Long-term treatment may include: Physical therapy exercises for strength and flexibility. Identifying the cause of your bursitis to prevent future episodes. Lifestyle changes, such as weight loss, to reduce the strain on the hip. Follow these instructions at home: Managing pain, stiffness, and swelling     If directed, put ice on the affected area. To do this: Put ice in a plastic bag. Place a towel between your skin and the bag. Leave the ice on for 20 minutes, 2-3 times a day. Remove the ice if your skin turns bright red. This is very important. If you cannot feel pain, heat, or cold, you have a greater risk of damage to the  area. Elevate your hip as much as you can without feeling pain. To do this, put a pillow under your hips while you lie down. If directed, apply heat to the affected area as often as told by your health care provider. Use the heat source that your health care provider recommends, such as a moist heat pack or a heating pad. Place a towel between your skin and the heat source. Leave the heat on for 20-30  minutes. Remove the heat if your skin turns bright red. This is especially important if you are unable to feel pain, heat, or cold. You may have a greater risk of getting burned. Activity Do not use your hip to support your body weight until your health care provider says that you can. Use crutches, a cane, or a walker as told by your health care provider. If the affected leg is one that you use to drive, ask your health care provider if it is safe to drive. Rest and protect your hip as much as possible until your pain and swelling get better. Return to your normal activities as told by your health care provider. Ask your health care provider what activities are safe for you. Do exercises as told by your health care provider. General instructions Take over-the-counter and prescription medicines only as told by your health care provider. Gently massage and stretch your injured area as often as is comfortable. Wear compression wraps only as told by your health care provider. If one of your legs is shorter than the other, get fitted for a shoe insert or orthotic. Your health care provider or physical therapist can tell you where to find these items and what size you need. Maintain a healthy weight. Follow instructions from your health care provider for weight control. These may include dietary restrictions. Keep all follow-up visits. This is important. How is this prevented? Exercise regularly or as told by your health care provider. Wear supportive footwear that is appropriate for your sport and daily activities. Warm up and stretch before being active. Cool down and stretch after being active. Take breaks regularly from repetitive activity. If an activity irritates your hip or causes pain, avoid the activity as much as possible. Avoid sitting down for long periods at a time. Where to find more information American Academy of Orthopa[employer]geons: orthoinfo.aaos.org Contact a health care  provider if: You have a fever. You develop new symptoms. You have trouble walking or doing everyday activities. You have pain that gets worse or does not get better with medicine. You develop red skin or a feeling of warmth in your hip area. Get help right away if: You cannot move your hip. You have severe pain. You cannot control the muscles in your feet. Summary Hip bursitis is the swelling of one or more of the fluid-filled sacs (bursae) in the hip joint. Hip bursitis can cause hip or groin pain, and symptoms often come and go over time. This condition is often treated by resting, icing, applying pressure (compression), and raising (elevating) the injured area. Other treatments may be needed. This information is not intended to replace advice given to you by your health care provider. Make sure you discuss any questions you have with your health care provider. Document Revised: 02/13/2021 Document Reviewed: 02/13/2021 Elsevier Patient Education  2023 [employer].

## 2022-02-10 ENCOUNTER — Encounter: Payer: Self-pay | Admitting: Family Medicine

## 2022-02-22 ENCOUNTER — Ambulatory Visit (INDEPENDENT_AMBULATORY_CARE_PROVIDER_SITE_OTHER): Payer: Medicare Other | Admitting: Family Medicine

## 2022-02-22 ENCOUNTER — Encounter: Payer: Self-pay | Admitting: Family Medicine

## 2022-02-22 VITALS — BP 113/74 | HR 64 | Temp 98.0°F | Ht 65.0 in | Wt 167.4 lb

## 2022-02-22 DIAGNOSIS — M25562 Pain in left knee: Secondary | ICD-10-CM | POA: Diagnosis not present

## 2022-02-22 DIAGNOSIS — M7042 Prepatellar bursitis, left knee: Secondary | ICD-10-CM | POA: Diagnosis not present

## 2022-02-22 DIAGNOSIS — M21612 Bunion of left foot: Secondary | ICD-10-CM | POA: Diagnosis not present

## 2022-02-22 MED ORDER — DICLOFENAC SODIUM 75 MG PO TBEC: 75.0000 mg | DELAYED_RELEASE_TABLET | Freq: Two times a day (BID) | ORAL | 0 refills | Status: DC

## 2022-02-22 NOTE — Patient Instructions (Signed)
Please follow up if symptoms do not improve or as needed.    Please go to our [facility] office to get your xrays done. You can walk in M-F between 8:30am- noon or 1pm - 5pm. Tell them you are there for xrays ordered by me. They will send me the results, then I will let you know the results with instructions.   Address: [address]  The Xray department is located in the basement.   Look into [employer]'s shoes. They may help.   Bunion A bunion (hallux valgus) is a bump that forms slowly on the inner side of the big toe joint. It occurs when the big toe turns toward the second toe. Bunions may be small at first, but they often get larger over time. They can make walking painful. What are the causes? This condition may be caused by: Wearing narrow or pointed shoes that force the big toe to press against the other toes. Abnormal foot development that causes the foot to roll inward. Changes in the foot that are caused by certain diseases, such as rheumatoid arthritis or polio. A foot injury. What increases the risk? The following factors may make you more likely to develop this condition: Wearing shoes that squeeze the toes together. Having certain diseases, such as: Rheumatoid arthritis. Polio. Cerebral palsy. Having family members who have bunions. Being born with abnormally shaped feet (a foot deformity), such as flat feet or low arches. Doing activities that put a lot of pressure on the feet, such as ballet dancing. What are the signs or symptoms?  The main symptom of this condition is a bump on your big toe that you can notice. Other symptoms may include: Pain. Redness and inflammation around your big toe. Thick or hardened skin on your big toe or between your toes. Stiffness or loss of motion in your big toe. Trouble with walking. How is this diagnosed? This condition may be diagnosed based on your symptoms, medical history, and activities. You may also have tests  and imaging, such as: X-rays. These allow your health care provider to check the position of the bones in your foot and look for damage to your joint. They also help your health care provider determine the severity of your bunion and the best way to treat it. Joint aspiration. In this test, a sample of fluid is removed from the toe joint. This test may be done if you are in a lot of pain. It helps rule out diseases that cause painful swelling of the joints, such as arthritis or gout. How is this treated? Treatment depends on the severity of your symptoms. The goal of treatment is to relieve symptoms and prevent your bunion from getting worse. Your health care provider may recommend: Wearing shoes that have a wide toe box, or using bunion pads to cushion the affected area. Taping your toes together to keep them in a normal position. Placing a device inside your shoe (orthotic device) to help reduce pressure on your toe joint. Taking medicine to ease pain and inflammation. Putting ice or heat on the affected area. Doing stretching exercises. Surgery, for severe cases. Follow these instructions at home: Managing pain, stiffness, and swelling     If directed, put ice on the painful area. To do this: Put ice in a plastic bag. Place a towel between your skin and the bag. Leave the ice on for 20 minutes, 2-3 times a day. Remove the ice if your skin turns  bright red. This is very important. If you cannot feel pain, heat, or cold, you have a greater risk of damage to the area. If directed, apply heat to the affected area before you exercise. Use the heat source that your health care provider recommends, such as a moist heat pack or a heating pad. Place a towel between your skin and the heat source. Leave the heat on for 20-30 minutes. Remove the heat if your skin turns bright red. This is especially important if you are unable to feel pain, heat, or cold. You have a greater risk of getting  burned. General instructions Do exercises as told by your health care provider. Support your toe joint with proper footwear, shoe padding, or taping as told by your health care provider. Take over-the-counter and prescription medicines only as told by your health care provider. Do not use any products that contain nicotine or tobacco, such as cigarettes, e-cigarettes, and chewing tobacco. If you need help quitting, ask your health care provider. Keep all follow-up visits. This is important. Contact a health care provider if: Your symptoms get worse. Your symptoms do not improve in 2 weeks. Get help right away if: You have severe pain and trouble with walking. Summary A bunion is a bump on the inner side of the big toe joint that forms when the big toe turns toward the second toe. Bunions can make walking painful. Treatment depends on the severity of your symptoms. Support your toe joint with proper footwear, shoe padding, or taping as told by your health care provider. This information is not intended to replace advice given to you by your health care provider. Make sure you discuss any questions you have with your health care provider. Document Revised: 06/25/2019 Document Reviewed: 06/25/2019 Elsevier Patient Education  2023 [employer].

## 2022-02-22 NOTE — Progress Notes (Signed)
Subjective  CC:  Chief Complaint  Patient presents with   Foot Injury    Pt states left knee and foot has pain, pt states she thinks its getting worse from the last time seen    Knee Pain    HPI: Ashley Duffy is a 66 y.o. female who presents to the office today to address the problems listed above in the chief complaint. Left foot and knee pain: Has large left bunion, intermittently painful but more frequently and more painful recently.  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  By the end of the day her bunion hurts significantly, causing her to limp.  She typically will need to walk on the outside of her foot.  Since doing that frequently, now she has left knee pain.  Anterior knee pain without swelling, locking, giving way or former injury.  Knee pain is more of an ache that she notices after being on her feet all day.  No pain with walking stairs or getting up after sitting.  Has not taken any medicines for either pain.  Assessment  1. Acute pain of left knee   2. Prepatellar bursitis of left knee   3. Bunion, left      Plan  Bunion: Active.  Discussed footcare, shoes, anti-inflammatories and monitoring.  May eventually need referral for surgical intervention. Knee pain, bursitis, plus minus osteoarthritis.  Likely exacerbated due to mechanical gait abnormality due to bunion pain. Will get xrays. Start diclofenac bid x 2 weeks, ice.   Follow up: f/u if not improving.  05/30/2022  Orders Placed This Encounter  Procedures   DG Knee Complete 4 Views Left   Meds ordered this encounter  Medications   diclofenac (VOLTAREN) 75 MG EC tablet    Sig: Take 1 tablet (75 mg total) by mouth 2 (two) times daily.    Dispense:  30 tablet    Refill:  0      I reviewed the patients updated PMH, FH, and SocHx.    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   Cholecalciferol (VITAMIN D3 PO) Vitamin D3   diclofenac (VOLTAREN) 75 MG EC tablet Take 1 tablet (75 mg total) by mouth 2 (two) times daily.   diclofenac Sodium (VOLTAREN) 1 % GEL Apply 4 g topically 4 (four) times daily as needed (knee pain).   Multiple Vitamin (MULTIVITAMIN ADULT PO) Take 1 tablet by mouth once a week.   Multiple Vitamins-Minerals (OCUVITE EXTRA) TABS Take 1 tablet by mouth once a week.   phentermine (ADIPEX-P) 37.5 MG tablet Take 1 tablet (37.5 mg total) by mouth daily before breakfast.    Allergies: Patient is allergic to atorvastatin. Family History: Patient family history includes Alcohol abuse in her father; Alzheimer's disease in her mother;  Arthritis in her brother; Breast cancer (age of onset: 73) in her mother; Breast cancer (age of onset: 73) in her maternal grandmother; Breast cancer (age of onset: 65) in her maternal aunt; Colon cancer in her father; Hypertension in her brother and mother; Thyroid disease in her mother. Social History:  Patient  reports that she has quit smoking. Her smoking use included cigarettes. She has never used smokeless tobacco. She reports current alcohol use. She reports that she does not use drugs.  Review of Systems: Constitutional: Negative for fever malaise or anorexia Cardiovascular: negative for chest pain Respiratory: negative for SOB or persistent cough Gastrointestinal: negative for abdominal pain  Objective  Vitals: BP  113/74 (BP Location: Right Arm, Patient Position: Sitting)   Pulse 64   Temp 98 F (36.7 C) (Temporal)   Ht 5\' 5"  (1.651 m)   Wt 167 lb 6.4 oz (75.9 kg)   SpO2 96%   BMI 27.86 kg/m  General: no acute distress , A&Ox3 Knees: left knee: ? Small effusion, + ttp prepatellar bursa, FROM w/o crepitus. Neg lachamans, murrays and no joint line ttp.  Able to squat w/o pain Left foot: large red bunion minimal ttp  Commons side effects, risks, benefits, and alternatives for medications and treatment plan prescribed today were discussed, and the patient expressed understanding of the given instructions. Patient is instructed to call or message via MyChart if he/she has any questions or concerns regarding our treatment plan. No barriers to understanding were identified. We discussed Red Flag symptoms and signs in detail. Patient expressed understanding regarding what to do in case of urgent or emergency type symptoms.  Medication list was reconciled, printed and provided to the patient in AVS. Patient instructions and summary information was reviewed with the patient as documented in the AVS. This note was prepared with assistance of Dragon voice recognition software. Occasional wrong-word or sound-a-like substitutions may have occurred due to the inherent limitations of voice recognition software  This visit occurred during the SARS-CoV-2 public health emergency.  Safety protocols were in place, including screening questions prior to the visit, additional usage of staff PPE, and extensive cleaning of exam room while observing appropriate contact time as indicated for disinfecting solutions.

## 2022-02-28 ENCOUNTER — Ambulatory Visit (INDEPENDENT_AMBULATORY_CARE_PROVIDER_SITE_OTHER)
Admission: RE | Admit: 2022-02-28 | Discharge: 2022-02-28 | Disposition: A | Discharge status: Home / Self Care | Payer: Medicare Other | Source: Ambulatory Visit | Attending: Family Medicine | Admitting: Family Medicine

## 2022-02-28 DIAGNOSIS — M7042 Prepatellar bursitis, left knee: Secondary | ICD-10-CM | POA: Diagnosis not present

## 2022-02-28 DIAGNOSIS — M25562 Pain in left knee: Secondary | ICD-10-CM | POA: Diagnosis not present

## 2022-03-05 ENCOUNTER — Encounter: Payer: Self-pay | Admitting: Family Medicine

## 2022-03-05 DIAGNOSIS — M1711 Unilateral primary osteoarthritis, right knee: Secondary | ICD-10-CM | POA: Insufficient documentation

## 2022-03-28 DIAGNOSIS — K080 Exfoliation of teeth due to systemic causes: Secondary | ICD-10-CM | POA: Diagnosis not present

## 2022-05-23 DIAGNOSIS — Z1231 Encounter for screening mammogram for malignant neoplasm of breast: Secondary | ICD-10-CM | POA: Diagnosis not present

## 2022-05-23 DIAGNOSIS — Z01419 Encounter for gynecological examination (general) (routine) without abnormal findings: Secondary | ICD-10-CM | POA: Diagnosis not present

## 2022-05-23 DIAGNOSIS — Z6828 Body mass index (BMI) 28.0-28.9, adult: Secondary | ICD-10-CM | POA: Diagnosis not present

## 2022-05-23 LAB — HM MAMMOGRAPHY

## 2022-05-30 ENCOUNTER — Ambulatory Visit (INDEPENDENT_AMBULATORY_CARE_PROVIDER_SITE_OTHER): Payer: Medicare Other

## 2022-05-30 VITALS — Wt 167.0 lb

## 2022-05-30 DIAGNOSIS — Z0000 Encounter for general adult medical examination without abnormal findings: Secondary | ICD-10-CM

## 2022-05-30 NOTE — Patient Instructions (Signed)
Ashley Duffy , Thank you for taking time to come for your Medicare Wellness Visit. I appreciate your ongoing commitment to your health goals. Please review the following plan we discussed and let me know if I can assist you in the future.   These are the goals we discussed:  Goals      Patient Stated     Continue to walk for exercise and get back to the gym     Patient Stated     Lose weight         This is a list of the screening recommended for you and due dates:  Health Maintenance  Topic Date Due   COVID-19 Vaccine (5 - 2023-24 season) 11/02/2021   Colon Cancer Screening  02/26/2022   Mammogram  05/16/2022   Medicare Annual Wellness Visit  05/30/2023   DEXA scan (bone density measurement)  07/03/2023   DTaP/Tdap/Td vaccine (2 - Td or Tdap) 05/25/2025   Pneumonia Vaccine  Completed   Flu Shot  Completed   Hepatitis C Screening: USPSTF Recommendation to screen - Ages 21-79 yo.  Completed   Zoster (Shingles) Vaccine  Completed   HPV Vaccine  Aged Out    Advanced directives: Please bring a copy of your health care power of attorney and living will to the office at your convenience.  Conditions/risks identified: lose weight and increase exercise   Next appointment: Follow up in one year for your annual wellness visit    Preventive Care 65 Years and Older, Female Preventive care refers to lifestyle choices and visits with your health care provider that can promote health and wellness. What does preventive care include? A yearly physical exam. This is also called an annual well check. Dental exams once or twice a year. Routine eye exams. Ask your health care provider how often you should have your eyes checked. Personal lifestyle choices, including: Daily care of your teeth and gums. Regular physical activity. Eating a healthy diet. Avoiding tobacco and drug use. Limiting alcohol use. Practicing safe sex. Taking low-dose aspirin every day. Taking vitamin and mineral  supplements as recommended by your health care provider. What happens during an annual well check? The services and screenings done by your health care provider during your annual well check will depend on your age, overall health, lifestyle risk factors, and family history of disease. Counseling  Your health care provider may ask you questions about your: Alcohol use. Tobacco use. Drug use. Emotional well-being. Home and relationship well-being. Sexual activity. Eating habits. History of falls. Memory and ability to understand (cognition). Work and work Statistician. Reproductive health. Screening  You may have the following tests or measurements: Height, weight, and BMI. Blood pressure. Lipid and cholesterol levels. These may be checked every 5 years, or more frequently if you are over 29 years old. Skin check. Lung cancer screening. You may have this screening every year starting at age 38 if you have a 30-pack-year history of smoking and currently smoke or have quit within the past 15 years. Fecal occult blood test (FOBT) of the stool. You may have this test every year starting at age 80. Flexible sigmoidoscopy or colonoscopy. You may have a sigmoidoscopy every 5 years or a colonoscopy every 10 years starting at age 31. Hepatitis C blood test. Hepatitis B blood test. Sexually transmitted disease (STD) testing. Diabetes screening. This is done by checking your blood sugar (glucose) after you have not eaten for a while (fasting). You may have this done every 1-3  years. Bone density scan. This is done to screen for osteoporosis. You may have this done starting at age 31. Mammogram. This may be done every 1-2 years. Talk to your health care provider about how often you should have regular mammograms. Talk with your health care provider about your test results, treatment options, and if necessary, the need for more tests. Vaccines  Your health care provider may recommend certain  vaccines, such as: Influenza vaccine. This is recommended every year. Tetanus, diphtheria, and acellular pertussis (Tdap, Td) vaccine. You may need a Td booster every 10 years. Zoster vaccine. You may need this after age 76. Pneumococcal 13-valent conjugate (PCV13) vaccine. One dose is recommended after age 50. Pneumococcal polysaccharide (PPSV23) vaccine. One dose is recommended after age 44. Talk to your health care provider about which screenings and vaccines you need and how often you need them. This information is not intended to replace advice given to you by your health care provider. Make sure you discuss any questions you have with your health care provider. Document Released: 03/17/2015 Document Revised: 11/08/2015 Document Reviewed: 12/20/2014 Elsevier Interactive Patient Education  2017 [place] Prevention in the Home Falls can cause injuries. They can happen to people of all ages. There are many things you can do to make your home safe and to help prevent falls. What can I do on the outside of my home? Regularly fix the edges of walkways and driveways and fix any cracks. Remove anything that might make you trip as you walk through a door, such as a raised step or threshold. Trim any bushes or trees on the path to your home. Use bright outdoor lighting. Clear any walking paths of anything that might make someone trip, such as rocks or tools. Regularly check to see if handrails are loose or broken. Make sure that both sides of any steps have handrails. Any raised decks and porches should have guardrails on the edges. Have any leaves, snow, or ice cleared regularly. Use sand or salt on walking paths during winter. Clean up any spills in your garage right away. This includes oil or grease spills. What can I do in the bathroom? Use night lights. Install grab bars by the toilet and in the tub and shower. Do not use towel bars as grab bars. Use non-skid mats or decals in  the tub or shower. If you need to sit down in the shower, use a plastic, non-slip stool. Keep the floor dry. Clean up any water that spills on the floor as soon as it happens. Remove soap buildup in the tub or shower regularly. Attach bath mats securely with double-sided non-slip rug tape. Do not have throw rugs and other things on the floor that can make you trip. What can I do in the bedroom? Use night lights. Make sure that you have a light by your bed that is easy to reach. Do not use any sheets or blankets that are too big for your bed. They should not hang down onto the floor. Have a firm chair that has side arms. You can use this for support while you get dressed. Do not have throw rugs and other things on the floor that can make you trip. What can I do in the kitchen? Clean up any spills right away. Avoid walking on wet floors. Keep items that you use a lot in easy-to-reach places. If you need to reach something above you, use a strong step stool that has a grab  bar. Keep electrical cords out of the way. Do not use floor polish or wax that makes floors slippery. If you must use wax, use non-skid floor wax. Do not have throw rugs and other things on the floor that can make you trip. What can I do with my stairs? Do not leave any items on the stairs. Make sure that there are handrails on both sides of the stairs and use them. Fix handrails that are broken or loose. Make sure that handrails are as long as the stairways. Check any carpeting to make sure that it is firmly attached to the stairs. Fix any carpet that is loose or worn. Avoid having throw rugs at the top or bottom of the stairs. If you do have throw rugs, attach them to the floor with carpet tape. Make sure that you have a light switch at the top of the stairs and the bottom of the stairs. If you do not have them, ask someone to add them for you. What else can I do to help prevent falls? Wear shoes that: Do not have high  heels. Have rubber bottoms. Are comfortable and fit you well. Are closed at the toe. Do not wear sandals. If you use a stepladder: Make sure that it is fully opened. Do not climb a closed stepladder. Make sure that both sides of the stepladder are locked into place. Ask someone to hold it for you, if possible. Clearly mark and make sure that you can see: Any grab bars or handrails. First and last steps. Where the edge of each step is. Use tools that help you move around (mobility aids) if they are needed. These include: Canes. Walkers. Scooters. Crutches. Turn on the lights when you go into a dark area. Replace any light bulbs as soon as they burn out. Set up your furniture so you have a clear path. Avoid moving your furniture around. If any of your floors are uneven, fix them. If there are any pets around you, be aware of where they are. Review your medicines with your doctor. Some medicines can make you feel dizzy. This can increase your chance of falling. Ask your doctor what other things that you can do to help prevent falls. This information is not intended to replace advice given to you by your health care provider. Make sure you discuss any questions you have with your health care provider. Document Released: 12/15/2008 Document Revised: 07/27/2015 Document Reviewed: 03/25/2014 Elsevier Interactive Patient Education  2017 [employer].

## 2022-05-30 NOTE — Progress Notes (Signed)
I connected with  Zetha A Freel on 05/30/22 by a audio enabled telemedicine application and verified that I am speaking with the correct person using two identifiers.  Patient Location: Home  Provider Location: Office/Clinic  I discussed the limitations of evaluation and management by telemedicine. The patient expressed understanding and agreed to proceed.   Subjective:   ALBERTIA VANDERWAL is a 67 y.o. female who presents for Medicare Annual (Subsequent) preventive examination.  Review of Systems     Cardiac Risk Factors include: advanced age (>47men, >89 women);dyslipidemia;hypertension     Objective:    Today's Vitals   05/30/22 1127  Weight: 167 lb (75.8 kg)   Body mass index is 27.79 kg/m.     05/30/2022   11:34 AM 05/21/2021   11:01 AM 05/07/2021    5:00 PM 05/07/2021   10:37 AM  Advanced Directives  Does Patient Have a Medical Advance Directive? Yes Yes No No  Type of Paramedic of [place];Living will Muse in Chart? No - copy requested No - copy requested    Would patient like information on creating a medical advance directive?   No - Patient declined No - Patient declined    Current Medications (verified) Outpatient Encounter Medications as of 05/30/2022  Medication Sig   amLODipine (NORVASC) 10 MG tablet Take 1 tablet (10 mg total) by mouth daily.   Cholecalciferol (VITAMIN D3 PO) Vitamin D3   ezetimibe (ZETIA) 10 MG tablet Take 10 mg by mouth daily.   Multiple Vitamin (MULTIVITAMIN ADULT PO) Take 1 tablet by mouth once a week.   Multiple Vitamins-Minerals (OCUVITE EXTRA) TABS Take 1 tablet by mouth once a week.   phentermine (ADIPEX-P) 37.5 MG tablet Take 1 tablet (37.5 mg total) by mouth daily before breakfast.   [DISCONTINUED] diclofenac (VOLTAREN) 75 MG EC tablet Take 1 tablet (75 mg total) by mouth 2 (two) times daily.   [DISCONTINUED] diclofenac Sodium (VOLTAREN) 1 % GEL  Apply 4 g topically 4 (four) times daily as needed (knee pain).   No facility-administered encounter medications on file as of 05/30/2022.    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   Panic attack as reaction to stress 11/27/2015   Past Surgical History:  Procedure Laterality Date   APPENDECTOMY     BREAST BIOPSY Left over 10 yrs ago   benign   COLONOSCOPY  2007   [place]   Family History  Problem Relation Age of Onset   Alzheimer's disease Mother    Breast cancer Mother 90   Hypertension Mother    Thyroid disease Mother    Alcohol abuse Father    Colon cancer Father    Arthritis Brother    Hypertension Brother    Breast cancer Maternal Aunt 35   Breast cancer Maternal Grandmother 50   Diabetes Neg Hx    Social History   Socioeconomic History   Marital status: Widowed    Spouse name: Not on file   Number of children: Not on file   Years of education: Not on file   Highest education level: Not on file  Occupational History   Not on file  Tobacco Use  Smoking status: Former    Types: Cigarettes   Smokeless tobacco: Never  Vaping Use   Vaping Use: Never used  Substance and Sexual Activity   Alcohol use: Yes    Comment: Pt quit last drink was 2 months ago   Drug use: No   Sexual activity: Never  Other Topics Concern   Not on file  Social History Narrative   Not on file   Social Determinants of Health   Financial Resource Strain: Low Risk  (05/30/2022)   Overall Financial Resource Strain (CARDIA)    Difficulty of Paying Living Expenses: Not hard at all  Food Insecurity: No Food Insecurity (05/30/2022)   Hunger Vital Sign    Worried About Running Out of Food in the Last Year: Never true    [place] in the Last Year: Never true  Transportation  Needs: No Transportation Needs (05/30/2022)   PRAPARE - Hydrologist (Medical): No    Lack of Transportation (Non-Medical): No  Physical Activity: Insufficiently Active (05/30/2022)   Exercise Vital Sign    Days of Exercise per Week: 3 days    Minutes of Exercise per Session: 30 min  Stress: No Stress Concern Present (05/30/2022)   [place]    Feeling of Stress : Not at all  Social Connections: Moderately Isolated (05/30/2022)   Social Connection and Isolation Panel [NHANES]    Frequency of Communication with Friends and Family: More than three times a week    Frequency of Social Gatherings with Friends and Family: More than three times a week    Attends Religious Services: Never    Marine scientist or Organizations: Yes    Attends Archivist Meetings: 1 to 4 times per year    Marital Status: Widowed    Tobacco Counseling Counseling given: Not Answered   Clinical Intake:  Pre-visit preparation completed: Yes  Pain : No/denies pain     BMI - recorded: 27.79 Nutritional Status: BMI 25 -29 Overweight Nutritional Risks: None Diabetes: No  How often do you need to have someone help you when you read instructions, pamphlets, or other written materials from your doctor or pharmacy?: 1 - Never  Diabetic?no  Interpreter Needed?: No  Information entered by :: Charlott Rakes, LPN   Activities of Daily Living    05/30/2022   11:35 AM  In your present state of health, do you have any difficulty performing the following activities:  Hearing? 0  Vision? 0  Difficulty concentrating or making decisions? 0  Walking or climbing stairs? 0  Dressing or bathing? 0  Doing errands, shopping? 0  Preparing Food and eating ? N  Using the Toilet? N  In the past six months, have you accidently leaked urine? N  Do you have problems with loss of bowel control? N  Managing your  Medications? N  Managing your Finances? N  Housekeeping or managing your Housekeeping? N    Patient Care Team: Leamon Arnt, MD as PCP - General (Family Medicine) Lady Gary, [facility] as Consulting Physician (Gynecology) Rosana Berger, MD as Consulting Physician (Gastroenterology)  Indicate any recent Medical Services you may have received from other than [facility] providers in the past year (date may be approximate).     Assessment:   This is a routine wellness examination for Kristyna.  Hearing/Vision screen Hearing Screening - Comments:: Pt denies any hearing issues  Vision Screening -  Comments:: Pt encouraged to follow up with eye provider   Dietary issues and exercise activities discussed: Current Exercise Habits: Home exercise routine, Type of exercise: walking, Time (Minutes): 30, Frequency (Times/Week): 3, Weekly Exercise (Minutes/Week): 90   Goals Addressed             This Visit's Progress    Patient Stated       Lose weight        Depression Screen    05/30/2022   11:33 AM 02/08/2022   11:06 AM 10/03/2021    3:07 PM 07/31/2021    8:59 AM 05/21/2021   11:00 AM 10/31/2020    9:40 AM 07/28/2020    9:04 AM  PHQ 2/9 Scores  PHQ - 2 Score 0 0 0 0 0 0 0  PHQ- 9 Score   0        Fall Risk    05/30/2022   11:35 AM 02/08/2022   11:06 AM 07/31/2021    8:59 AM 05/21/2021   11:03 AM 10/31/2020    9:40 AM  Fall Risk   Falls in the past year? 0 0 0 0 0  Number falls in past yr: 0 0 0 0 0  Injury with Fall? 0 0 0 0 0  Risk for fall due to : Impaired vision No Fall Risks No Fall Risks Impaired vision   Follow up Falls prevention discussed Falls evaluation completed Falls evaluation completed Falls prevention discussed     FALL RISK PREVENTION PERTAINING TO THE HOME:  Any stairs in or around the home? Yes  If so, are there any without handrails? No  Home free of loose throw rugs in walkways, pet beds, electrical cords, etc? Yes  Adequate lighting in  your home to reduce risk of falls? Yes   ASSISTIVE DEVICES UTILIZED TO PREVENT FALLS:  Life alert? Yes apple watch  Use of a cane, walker or w/c? No  Grab bars in the bathroom? No  Shower chair or bench in shower? No  Elevated toilet seat or a handicapped toilet? No   TIMED UP AND GO:  Was the test performed? No .  Cognitive Function:        05/30/2022   11:35 AM 05/21/2021   11:05 AM  6CIT Screen  What Year? 0 points 0 points  What month? 0 points 0 points  What time? 0 points 0 points  Count back from 20 0 points 0 points  Months in reverse 0 points 0 points  Repeat phrase 0 points 0 points  Total Score 0 points 0 points    Immunizations Immunization History  Administered Date(s) Administered   Fluad Quad(high Dose 65+) 12/03/2021   Influenza, Seasonal, Injecte, Preservative Fre 01/12/2003, 11/27/2015   Influenza,inj,Quad PF,6+ Mos 02/14/2017, 01/23/2018, 12/17/2018   Influenza-Unspecified 12/18/2020   PFIZER(Purple Top)SARS-COV-2 Vaccination 05/19/2019, 06/09/2019, 12/22/2019   PNEUMOCOCCAL CONJUGATE-20 07/31/2021   [place] Covid Bivalent Pediatric Vaccine(44mos to <21yrs) 12/20/2020   Pneumococcal Polysaccharide-23 08/06/2012, 07/28/2020   Tdap 05/26/2015   Zoster Recombinat (Shingrix) 01/23/2018, 07/28/2020   Zoster, Live 05/26/2015    TDAP status: Up to date  Flu Vaccine status: Up to date  Pneumococcal vaccine status: Up to date  Covid-19 vaccine status: Completed vaccines  Qualifies for Shingles Vaccine? Yes   Zostavax completed Yes   Shingrix Completed?: Yes  Screening Tests Health Maintenance  Topic Date Due   COVID-19 Vaccine (5 - 2023-24 season) 11/02/2021   COLONOSCOPY (Pts 45-39yrs Insurance coverage will need to be confirmed)  02/26/2022   MAMMOGRAM  05/16/2022   Medicare Annual Wellness (AWV)  05/30/2023   DEXA SCAN  07/03/2023   DTaP/Tdap/Td (2 - Td or Tdap) 05/25/2025   Pneumonia Vaccine 49+ Years old  Completed   INFLUENZA VACCINE   Completed   Hepatitis C Screening  Completed   Zoster Vaccines- Shingrix  Completed   HPV VACCINES  Aged Out    Health Maintenance  Health Maintenance Due  Topic Date Due   COVID-19 Vaccine (5 - 2023-24 season) 11/02/2021   COLONOSCOPY (Pts 45-59yrs Insurance coverage will need to be confirmed)  02/26/2022   MAMMOGRAM  05/16/2022    Colorectal cancer screening: Type of screening: Colonoscopy. Completed 02/26/17. Repeat every 5 years  Mammogram status: Completed 05/15/21. Repeat every year  Bone Density status: Completed 07/02/20. Results reflect: Bone density results: OSTEOPENIA. Repeat every 2 years.  Additional Screening:  Hepatitis C Screening: Completed 08/25/15  Vision Screening: Recommended annual ophthalmology exams for early detection of glaucoma and other disorders of the eye. Is the patient up to date with their annual eye exam?  No  Who is the provider or what is the name of the office in which the patient attends annual eye exams? Encouraged to follow up with provider  If pt is not established with a provider, would they like to be referred to a provider to establish care? No .   Dental Screening: Recommended annual dental exams for proper oral hygiene  Community Resource Referral / Chronic Care Management: CRR required this visit?  No   CCM required this visit?  No      Plan:     I have personally reviewed and noted the following in the patient's chart:   Medical and social history Use of alcohol, tobacco or illicit drugs  Current medications and supplements including opioid prescriptions. Patient is not currently taking opioid prescriptions. Functional ability and status Nutritional status Physical activity Advanced directives List of other physicians Hospitalizations, surgeries, and ER visits in previous 12 months Vitals Screenings to include cognitive, depression, and falls Referrals and appointments  In addition, I have reviewed and discussed with  patient certain preventive protocols, quality metrics, and best practice recommendations. A written personalized care plan for preventive services as well as general preventive health recommendations were provided to patient.     Willette Brace, LPN   [id number]   Nurse Notes: none

## 2022-08-07 DIAGNOSIS — D122 Benign neoplasm of ascending colon: Secondary | ICD-10-CM | POA: Diagnosis not present

## 2022-08-07 DIAGNOSIS — K648 Other hemorrhoids: Secondary | ICD-10-CM | POA: Diagnosis not present

## 2022-08-07 DIAGNOSIS — Z800 Family history of malignant neoplasm of digestive organs: Secondary | ICD-10-CM | POA: Diagnosis not present

## 2022-08-07 DIAGNOSIS — K5730 Diverticulosis of large intestine without perforation or abscess without bleeding: Secondary | ICD-10-CM | POA: Diagnosis not present

## 2022-08-07 DIAGNOSIS — K6289 Other specified diseases of anus and rectum: Secondary | ICD-10-CM | POA: Diagnosis not present

## 2022-08-07 DIAGNOSIS — D123 Benign neoplasm of transverse colon: Secondary | ICD-10-CM | POA: Diagnosis not present

## 2022-08-07 DIAGNOSIS — D125 Benign neoplasm of sigmoid colon: Secondary | ICD-10-CM | POA: Diagnosis not present

## 2022-08-07 DIAGNOSIS — D128 Benign neoplasm of rectum: Secondary | ICD-10-CM | POA: Diagnosis not present

## 2022-08-07 DIAGNOSIS — Z1211 Encounter for screening for malignant neoplasm of colon: Secondary | ICD-10-CM | POA: Diagnosis not present

## 2022-08-07 LAB — HM COLONOSCOPY

## 2022-08-09 DIAGNOSIS — D128 Benign neoplasm of rectum: Secondary | ICD-10-CM | POA: Diagnosis not present

## 2022-08-09 DIAGNOSIS — D123 Benign neoplasm of transverse colon: Secondary | ICD-10-CM | POA: Diagnosis not present

## 2022-08-22 ENCOUNTER — Ambulatory Visit (INDEPENDENT_AMBULATORY_CARE_PROVIDER_SITE_OTHER): Payer: Medicare Other | Admitting: Family Medicine

## 2022-08-22 ENCOUNTER — Encounter: Payer: Self-pay | Admitting: Family Medicine

## 2022-08-22 VITALS — BP 118/70 | HR 68 | Temp 98.4°F | Ht 65.0 in | Wt 151.2 lb

## 2022-08-22 DIAGNOSIS — F5102 Adjustment insomnia: Secondary | ICD-10-CM | POA: Diagnosis not present

## 2022-08-22 DIAGNOSIS — F4322 Adjustment disorder with anxiety: Secondary | ICD-10-CM | POA: Diagnosis not present

## 2022-08-22 DIAGNOSIS — R7989 Other specified abnormal findings of blood chemistry: Secondary | ICD-10-CM | POA: Diagnosis not present

## 2022-08-22 DIAGNOSIS — M21612 Bunion of left foot: Secondary | ICD-10-CM

## 2022-08-22 DIAGNOSIS — I10 Essential (primary) hypertension: Secondary | ICD-10-CM | POA: Diagnosis not present

## 2022-08-22 DIAGNOSIS — Z0000 Encounter for general adult medical examination without abnormal findings: Secondary | ICD-10-CM

## 2022-08-22 DIAGNOSIS — Z800 Family history of malignant neoplasm of digestive organs: Secondary | ICD-10-CM

## 2022-08-22 DIAGNOSIS — E782 Mixed hyperlipidemia: Secondary | ICD-10-CM

## 2022-08-22 LAB — CBC WITH DIFFERENTIAL/PLATELET
Basophils Absolute: 0 10*3/uL (ref 0.0–0.1)
Basophils Relative: 0.4 % (ref 0.0–3.0)
Eosinophils Absolute: 0.1 10*3/uL (ref 0.0–0.7)
Eosinophils Relative: 2 % (ref 0.0–5.0)
HCT: 43.9 % (ref 36.0–46.0)
Hemoglobin: 14.5 g/dL (ref 12.0–15.0)
Lymphocytes Relative: 26.6 % (ref 12.0–46.0)
Lymphs Abs: 2 10*3/uL (ref 0.7–4.0)
MCHC: 33 g/dL (ref 30.0–36.0)
MCV: 90.8 fl (ref 78.0–100.0)
Monocytes Absolute: 0.6 10*3/uL (ref 0.1–1.0)
Monocytes Relative: 8 % (ref 3.0–12.0)
Neutro Abs: 4.7 10*3/uL (ref 1.4–7.7)
Neutrophils Relative %: 63 % (ref 43.0–77.0)
Platelets: 386 10*3/uL (ref 150.0–400.0)
RBC: 4.83 Mil/uL (ref 3.87–5.11)
RDW: 14.5 % (ref 11.5–15.5)
WBC: 7.4 10*3/uL (ref 4.0–10.5)

## 2022-08-22 LAB — COMPREHENSIVE METABOLIC PANEL
ALT: 43 U/L — ABNORMAL HIGH (ref 0–35)
AST: 27 U/L (ref 0–37)
Albumin: 4.7 g/dL (ref 3.5–5.2)
Alkaline Phosphatase: 96 U/L (ref 39–117)
BUN: 16 mg/dL (ref 6–23)
CO2: 26 mEq/L (ref 19–32)
Calcium: 10.8 mg/dL — ABNORMAL HIGH (ref 8.4–10.5)
Chloride: 104 mEq/L (ref 96–112)
Creatinine, Ser: 0.73 mg/dL (ref 0.40–1.20)
GFR: 85.17 mL/min (ref >60.00)
Glucose, Bld: 90 mg/dL (ref 70–99)
Potassium: 4 mEq/L (ref 3.5–5.1)
Sodium: 139 mEq/L (ref 135–145)
Total Bilirubin: 0.6 mg/dL (ref 0.2–1.2)
Total Protein: 7.4 g/dL (ref 6.0–8.3)

## 2022-08-22 LAB — LIPID PANEL
Cholesterol: 203 mg/dL — ABNORMAL HIGH (ref 0–200)
HDL: 71.6 mg/dL (ref >39.00)
LDL Cholesterol: 117 mg/dL — ABNORMAL HIGH (ref 0–99)
NonHDL: 131.81
Total CHOL/HDL Ratio: 3
Triglycerides: 76 mg/dL (ref 0.0–149.0)
VLDL: 15.2 mg/dL (ref 0.0–40.0)

## 2022-08-22 MED ORDER — PHENTERMINE HCL 37.5 MG PO TABS: 37.5000 mg | ORAL_TABLET | Freq: Every day | ORAL | 5 refills | Status: DC

## 2022-08-22 MED ORDER — AMLODIPINE BESYLATE 10 MG PO TABS: 10.0000 mg | ORAL_TABLET | Freq: Every day | ORAL | 3 refills | Status: DC

## 2022-08-22 NOTE — Addendum Note (Signed)
Addended by: Trudie Reed A on: 08/22/2022 11:30 AM   Modules accepted: Orders

## 2022-08-22 NOTE — Progress Notes (Signed)
Subjective  Chief Complaint  Patient presents with   Annual Exam    Pt here for Annual Exam and is currently fasting     HPI: Ashley Duffy is a 67 y.o. female who presents to [facility] [place] today for a Female Wellness Visit. She also has the concerns and/or needs as listed above in the chief complaint. These will be addressed in addition to the Health Maintenance Visit.   Wellness Visit: annual visit with health maintenance review and exam witout Pap  HM; screens are current. Imms up to date.  Had colonoscopy done by [facility] last month.  Awaiting results.  Positive family history so on every 5 year screens.  Doing great with diet and exercise.  Likes to hike.  Has used phentermine successfully.  Would like to restart.  Still wants to lose a little bit more awake so she can hike.  Weight loss has improved left knee osteoarthritis. Chronic disease f/u and/or acute problem visit: (deemed necessary to be done in addition to the wellness visit): Hypertension is well-controlled on amlodipine 10 mg daily.  She feels great History of anxiety and adjustment insomnia: Both improved.  Now behaviorally manage.  No medication needs Hyperlipidemia: Intolerant to statins.  Taking Zetia 10.  Diet is much improved.  More fiber, low carbohydrate low-fat.  Fasting for recheck today Left bunion: Remains swollen and tender.  Limiting exercise and activities.  Assessment  1. Annual physical exam   2. Essential hypertension   3. Family history of colon cancer   4. Adjustment insomnia   5. Adjustment disorder with anxiety   6. Mixed hyperlipidemia      Plan  Female Wellness Visit: Age appropriate Health Maintenance and Prevention measures were discussed with patient. Included topics are cancer screening recommendations, ways to keep healthy (see AVS) including dietary and exercise recommendations, regular eye and dental care, use of seat belts, and avoidance of moderate alcohol use  and tobacco use.  Await colonoscopy report BMI: discussed patient's BMI and encouraged positive lifestyle modifications to help get to or maintain a target BMI. HM needs and immunizations were addressed and ordered. See below for orders. See HM and immunization section for updates. Routine labs and screening tests ordered including cmp, cbc and lipids where appropriate. Discussed recommendations regarding Vit D and calcium supplementation (see AVS)  Chronic disease management visit and/or acute problem visit: Hypertension well-controlled on amlodipine 10.  Check renal function and electrolytes Weight loss: Doing very well.  Wants to get to normal BMI.  She is close.  Restart phentermine as needed Hyperlipidemia on Zetia 10.  Recheck lipids today. Insomnia and anxiety are well-controlled.  She is in a good place.  Traveling, exercising  Follow up: 12 months for complete physical No orders of the defined types were placed in this encounter.  Meds ordered this encounter  Medications   amLODipine (NORVASC) 10 MG tablet    Sig: Take 1 tablet (10 mg total) by mouth daily.    Dispense:  90 tablet    Refill:  3      Body mass index is 25.16 kg/m. Wt Readings from Last 3 Encounters:  08/22/22 151 lb 3.2 oz (68.6 kg)  05/30/22 167 lb (75.8 kg)  02/22/22 167 lb 6.4 oz (75.9 kg)     Patient Active Problem List   Diagnosis Date Noted   Mixed hyperlipidemia 07/31/2021    Priority: High    Intoleratant to statins; zetia. 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 04/16/2015    Priority: Low   History of diverticulitis 04/13/2015    Priority: Low   Health Maintenance  Topic Date Due   COVID-19 Vaccine (5 - 2023-24 season) 09/07/2022 (Originally 11/02/2021)   INFLUENZA VACCINE  10/03/2022   MAMMOGRAM  05/23/2023   Medicare Annual Wellness (AWV)  05/30/2023   DEXA SCAN  07/03/2023   DTaP/Tdap/Td (2 - Td or Tdap) 05/25/2025   Colonoscopy  08/07/2027   Pneumonia Vaccine 2+ Years old  Completed   Hepatitis C Screening  Completed   Zoster Vaccines- Shingrix  Completed   HPV VACCINES  Aged Out   Immunization History  Administered Date(s) Administered   Fluad Quad(high Dose 65+) 12/03/2021   Influenza, Seasonal, Injecte, Preservative Fre 01/12/2003, 11/27/2015   Influenza,inj,Quad PF,6+ Mos 02/14/2017, 01/23/2018, 12/17/2018   Influenza-Unspecified 12/18/2020   PFIZER(Purple Top)SARS-COV-2 Vaccination 05/19/2019, 06/09/2019, 12/22/2019   PNEUMOCOCCAL CONJUGATE-20 07/31/2021   [employer] Covid Bivalent Pediatric Vaccine(82mos to <68yrs) 12/20/2020   Pneumococcal Polysaccharide-23 08/06/2012, 07/28/2020   Tdap 05/26/2015   Zoster Recombinat (Shingrix) 01/23/2018, 07/28/2020   Zoster, Live 05/26/2015   We updated and reviewed the patient's past history in detail and it is documented below. Allergies: Patient is allergic to atorvastatin. 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 Past Surgical History Patient  has a past surgical history that includes Lumbar disc surgery (1998); Appendectomy; Colonoscopy (2007); and Breast biopsy (Left, over 10 yrs ago). Family History: Patient family history  includes Alcohol abuse in her father; Alzheimer's disease in her mother; Arthritis in her brother; Breast cancer (age of onset: 68) in her mother; Breast cancer (age of onset: 52) in her maternal grandmother; Breast cancer (age of onset: 31) in her maternal aunt; Colon cancer in her father; Hypertension in her brother and mother; Thyroid disease in her mother. Social History:  Patient  reports that she has quit smoking. Her smoking use included cigarettes. She has never used smokeless tobacco. She reports current alcohol use. She reports that she does not use drugs.  Review of Systems: Constitutional: negative for fever or malaise Ophthalmic: negative for photophobia, double vision or loss of vision Cardiovascular: negative for chest pain, dyspnea on exertion, or new LE swelling Respiratory: negative for SOB or persistent cough Gastrointestinal: negative for abdominal pain, change in bowel habits or melena Genitourinary: negative for dysuria or gross hematuria, no abnormal uterine bleeding or disharge Musculoskeletal: negative for new gait disturbance or muscular weakness Integumentary: negative for new or persistent rashes, no breast lumps Neurological: negative for TIA or stroke symptoms Psychiatric: negative for SI or delusions Allergic/Immunologic: negative for hives  Patient Care Team    Relationship Specialty Notifications  Start End  Willow Ora, MD PCP - General Family Medicine  02/14/17   Ginette Otto, [facility] Consulting Physician Gynecology  02/14/17    Comment: Dr. Darleen Crocker, Kristine Garbe, MD Consulting Physician Gastroenterology  04/05/19     Objective  Vitals: BP 118/70   Pulse 68   Temp 98.4 F (36.9 C)   Ht 5\' 5"  (1.651 m)   Wt 151 lb 3.2 oz (68.6 kg)   SpO2 97%   BMI 25.16 kg/m  General:  Well developed, well nourished, no acute distress  Psych:  Alert and orientedx3,normal mood and affect HEENT:  Normocephalic, atraumatic, non-icteric sclera,   supple neck without adenopathy, mass or thyromegaly Cardiovascular:  Normal S1, S2, RRR without gallop, rub or murmur Respiratory:  Good breath sounds bilaterally, CTAB with normal respiratory effort Gastrointestinal: normal bowel sounds, soft, non-tender, no noted masses. No HSM MSK: extremities without edema, left bunion is red and tender and swollen Neurologic:    Mental status is normal.  Gross motor and sensory exams are normal.  No tremor  Commons side effects, risks, benefits, and alternatives for medications and treatment plan prescribed today were discussed, and the patient expressed understanding of the given instructions. Patient is instructed to call or message via MyChart if he/she has any questions or concerns regarding our treatment plan. No barriers to understanding were identified. We discussed Red Flag symptoms and signs in detail. Patient expressed understanding regarding what to do in case of urgent or emergency type symptoms.  Medication list was reconciled, printed and provided to the patient in AVS. Patient instructions and summary information was reviewed with the patient as documented in the AVS. This note was prepared with assistance of Dragon voice recognition software. Occasional wrong-word or sound-a-like substitutions may have occurred due to the inherent limitations of voice recognition software

## 2022-08-27 ENCOUNTER — Encounter: Payer: Self-pay | Admitting: Family Medicine

## 2022-08-27 DIAGNOSIS — R7989 Other specified abnormal findings of blood chemistry: Secondary | ICD-10-CM | POA: Insufficient documentation

## 2022-08-27 NOTE — Addendum Note (Signed)
Addended by: Asencion Partridge on: 08/27/2022 12:50 PM   Modules accepted: Orders

## 2022-08-27 NOTE — Progress Notes (Signed)
Dear Ms. Pierotti, Thank you for allowing me to care for you at your recent office visit.  I wanted to let you know that I have reviewed your lab test results and am happy to report that they are mostly all stable. Your liver function test (ALT) remains mildly elevated.  It has been up for about a year. This could be due to fatty liver, tylenol, or other things. I recommend checking an ultrasound and checking further lab tests to be sure nothing else is going on. Please schedule a follow up visit in 6-12 weeks, stop all tylenol and limit alcohol, and I will order an ultrasound and we will do more lab work at that visit.  Everything else looks good!  Sincerely, Dr. Mardelle Matte

## 2022-09-04 ENCOUNTER — Ambulatory Visit: Payer: Medicare Other | Admitting: Podiatry

## 2022-09-10 ENCOUNTER — Ambulatory Visit
Admission: RE | Admit: 2022-09-10 | Discharge: 2022-09-10 | Disposition: A | Discharge status: Home / Self Care | Payer: Medicare Other | Source: Ambulatory Visit | Attending: Family Medicine | Admitting: Family Medicine

## 2022-09-10 DIAGNOSIS — R7989 Other specified abnormal findings of blood chemistry: Secondary | ICD-10-CM

## 2022-09-11 ENCOUNTER — Ambulatory Visit: Payer: Medicare Other | Admitting: Podiatry

## 2022-09-11 DIAGNOSIS — M7752 Other enthesopathy of left foot: Secondary | ICD-10-CM | POA: Diagnosis not present

## 2022-09-11 NOTE — Progress Notes (Signed)
  Subjective:  Patient ID: Ashley Duffy, female    DOB: 05/15/1955,  MRN: [id number]  Chief Complaint  Patient presents with   Bunions    Left foot bunion  Pt stated that she can not find shoes that fit comfortably because of her bunion     67 y.o. female presents with the above complaint.  Patient presents with severe left first metatarsophalangeal joint arthritis.  Patient states is painful to touch is progressive gotten worse.  She states she cannot find any shoes and is causing her a lot of pain.  She wants to discuss treatment options she has not done anything for it.  She denies any other acute complaints pain scale 7 out of 10 dull achy in nature   Review of Systems: Negative except as noted in the HPI. Denies N/V/F/Ch.  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   Panic attack as reaction to stress 11/27/2015    Current Outpatient Medications:    amLODipine (NORVASC) 10 MG tablet, Take 1 tablet (10 mg total) by mouth daily., Disp: 90 tablet, Rfl: 3   Cholecalciferol (VITAMIN D3 PO), Vitamin D3, Disp: , Rfl:    ezetimibe (ZETIA) 10 MG tablet, Take 10 mg by mouth daily., Disp: , Rfl:    Multiple Vitamin (MULTIVITAMIN ADULT PO), Take 1 tablet by mouth once a week., Disp: , Rfl:    Multiple Vitamins-Minerals (OCUVITE EXTRA) TABS, Take 1 tablet by mouth once a week., Disp: , Rfl:    phentermine (ADIPEX-P) 37.5 MG tablet, Take 1 tablet (37.5 mg total) by mouth daily before breakfast., Disp: 30 tablet, Rfl: 5  Social History   Tobacco Use  Smoking Status Former   Types: Cigarettes  Smokeless Tobacco Never    Allergies  Allergen Reactions   Atorvastatin Other (See Comments)    Severe myalgias   Objective:  There were no vitals filed for this visit. There is no height or weight on file to calculate BMI. Constitutional Well  developed. Well nourished.  Vascular Dorsalis pedis pulses palpable bilaterally. Posterior tibial pulses palpable bilaterally. Capillary refill normal to all digits.  No cyanosis or clubbing noted. Pedal hair growth normal.  Neurologic Normal speech. Oriented to person, place, and time. Epicritic sensation to light touch grossly present bilaterally.  Dermatologic Nails well groomed and normal in appearance. No open wounds. No skin lesions.  Orthopedic: Pain on palpation left first metatarsophalangeal joint pain with range of motion of the joint deep intra-articular pain noted limited range of motion noted due to hallux rigidus   Radiographs: None Assessment:   1. Capsulitis of metatarsophalangeal (MTP) joint of left foot    Plan:  Patient was evaluated and treated and all questions answered.  Left first metatarsophalangeal joint capsulitis with underlying severe arthritis -All questions and concerns were discussed with the patient in extensive detail given the amount of pain that she is having she will benefit from steroid injection to help decrease acute inflammatory component associate with pain.  Patient agrees with plan to proceed with steroid injection -A steroid injection was performed at left first MTP using 1% plain Lidocaine and 10 mg of Kenalog. This was well tolerated. -Patient will think about surgical fusion in the future   No follow-ups on file.

## 2022-09-17 ENCOUNTER — Encounter: Payer: Self-pay | Admitting: Family Medicine

## 2022-09-17 DIAGNOSIS — K760 Fatty (change of) liver, not elsewhere classified: Secondary | ICD-10-CM | POA: Insufficient documentation

## 2022-09-17 NOTE — Progress Notes (Signed)
See mychart note Ultrasound fatty liver, no gallstones.  Otherwise normal,

## 2022-10-02 DIAGNOSIS — K080 Exfoliation of teeth due to systemic causes: Secondary | ICD-10-CM | POA: Diagnosis not present

## 2022-10-28 DIAGNOSIS — K080 Exfoliation of teeth due to systemic causes: Secondary | ICD-10-CM | POA: Diagnosis not present

## 2022-11-26 DIAGNOSIS — K080 Exfoliation of teeth due to systemic causes: Secondary | ICD-10-CM | POA: Diagnosis not present

## 2022-12-03 DIAGNOSIS — K080 Exfoliation of teeth due to systemic causes: Secondary | ICD-10-CM | POA: Diagnosis not present

## 2023-01-01 DIAGNOSIS — M79672 Pain in left foot: Secondary | ICD-10-CM | POA: Diagnosis not present

## 2023-01-03 DIAGNOSIS — M2022 Hallux rigidus, left foot: Secondary | ICD-10-CM | POA: Diagnosis not present

## 2023-01-14 ENCOUNTER — Telehealth: Payer: Self-pay | Admitting: Family Medicine

## 2023-01-14 NOTE — Telephone Encounter (Signed)
Patient called requesting a small dosage of xanax be sent in for her. States anxiety is high since [employer] is closing which is her employer. Patient does have a VV tomorrow @ 3pm but wanted to know if something could be sent in prior. Please Advise.

## 2023-01-15 ENCOUNTER — Telehealth: Payer: Medicare Other | Admitting: Family Medicine

## 2023-01-15 VITALS — Ht 65.0 in | Wt 153.0 lb

## 2023-01-15 DIAGNOSIS — F4322 Adjustment disorder with anxiety: Secondary | ICD-10-CM

## 2023-01-15 DIAGNOSIS — F5102 Adjustment insomnia: Secondary | ICD-10-CM | POA: Diagnosis not present

## 2023-01-15 DIAGNOSIS — F4329 Adjustment disorder with other symptoms: Secondary | ICD-10-CM

## 2023-01-15 MED ORDER — ALPRAZOLAM 0.5 MG PO TABS: 0.5000 mg | ORAL_TABLET | Freq: Two times a day (BID) | ORAL | 0 refills | Status: AC | PRN

## 2023-01-15 NOTE — Progress Notes (Signed)
Virtual Visit via Video Note  Subjective  CC:  Chief Complaint  Patient presents with   Anxiety    Pt stated that she has been dealing with anxiety since the election.   I connected with Ashley Duffy on 01/15/23 at  3:00 PM EST by a video enabled telemedicine application and verified that I am speaking with the correct person using two identifiers. Location patient: Home Location provider: [facility] [address] Office Persons participating in the virtual visit: Ashley Duffy Flake, MD Trudie Reed CMA  I discussed the limitations of evaluation and management by telemedicine and the availability of in person appointments. The patient expressed understanding and agreed to proceed. HPI: Ashley Duffy is a 67 y.o. female who was contacted today to address the problems listed above in the chief complaint. 67 year old female who reports that over the last 2 to 4 weeks her anxiety has become problematic and symptomatic again.  Feeling nervous and anxious a lot.  Trouble sleeping again.  Worries over the recent political climate and election outcome.  She reports that her husband died 8 years prior right before the election and she feels that this possibly could be related to her current anxiety symptoms.  2016 election was similar to this election, outcome was the same.  She is worried about being able to handle living through another 4 years of this administration.  She denies panic attacks.  She has worked hard to try to take good care of herself.  She is walking for exercise, eating well but cannot seem to settle her brain.  Sleep is hard to come by.  She has used Xanax in the past when she struggled through the loss of her husband.  This worked well for her.  She has never been on mood medications.  She has a supportive network of friends and colleagues at work.  Assessment  1. Adjustment disorder with anxiety   2. Anniversary reaction   3. Adjustment  insomnia      Plan  Anxiety: Counseling done.  Will use Xanax as a short-term solution to help calm her nerves and help with her sleep.  Discussed other options of care if needed for more long-term support.  Consider therapy, SSRI.  She will follow-up with me if things are not improving.  Discussed risk benefits and appropriate expectations of Xanax use.  I discussed the assessment and treatment plan with the patient. The patient was provided an opportunity to ask questions and all were answered. The patient agreed with the plan and demonstrated an understanding of the instructions.   The patient was advised to call back or seek an in-person evaluation if the symptoms worsen or if the condition fails to improve as anticipated. Follow up: As scheduled for CPE 06/05/2023  Meds ordered this encounter  Medications   ALPRAZolam (XANAX) 0.5 MG tablet    Sig: Take 1 tablet (0.5 mg total) by mouth 2 (two) times daily as needed for anxiety.    Dispense:  60 tablet    Refill:  0      I reviewed the patients updated PMH, FH, and SocHx.    Patient Active Problem List   Diagnosis Date Noted   Mixed hyperlipidemia 07/31/2021    Priority: High   Essential hypertension 04/13/2015    Priority: High   Family history of colon cancer 04/13/2015    Priority: High   Hepatic steatosis 09/17/2022    Priority:  Medium    Elevated liver function tests 08/27/2022    Priority: Medium    Primary osteoarthritis of right knee 03/05/2022    Priority: Medium    Statin myopathy 08/02/2020    Priority: Medium    Adjustment disorder with anxiety 07/28/2020    Priority: Medium    Osteopenia 10/05/2018    Priority: Medium    Adjustment insomnia 11/27/2015    Priority: Medium    Bunion, left 04/05/2019    Priority: Low   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   Cholecalciferol (VITAMIN D3 PO) Vitamin D3   ezetimibe (ZETIA) 10 MG tablet Take 10 mg by mouth daily.   Multiple Vitamin (MULTIVITAMIN ADULT PO) Take 1 tablet by mouth once a week.   Multiple Vitamins-Minerals (OCUVITE EXTRA) TABS Take 1 tablet by mouth once a week.   phentermine (ADIPEX-P) 37.5 MG tablet Take 1 tablet (37.5 mg total) by mouth daily before breakfast.    Allergies: Patient is allergic to atorvastatin. Family History: Patient family history includes Alcohol abuse in her father; Alzheimer's disease in her mother; Arthritis in her brother; Breast cancer (age of onset: 30) in her mother; Breast cancer (age of onset: 54) in her maternal grandmother; Breast cancer (age of onset: 3) in her maternal aunt; Colon cancer in her father; Hypertension in her brother and mother; Thyroid disease in her mother. Social History:  Patient  reports that she has quit smoking. Her smoking use included cigarettes. She has never used smokeless tobacco. She reports current alcohol use. She reports that she does not use drugs.  Review of Systems: Constitutional: Negative for fever malaise or anorexia Cardiovascular: negative for chest pain Respiratory: negative for SOB or persistent cough Gastrointestinal: negative for abdominal pain  OBJECTIVE Vitals: Ht 5\' 5"  (1.651 m)   Wt 153 lb (69.4 kg)   BMI 25.46 kg/m  General: no acute distress , A&Ox3 Psych: Appears anxious but good insight, normal speech  Willow Ora, MD

## 2023-01-16 NOTE — Telephone Encounter (Signed)
Rx sent 

## 2023-01-20 DIAGNOSIS — K080 Exfoliation of teeth due to systemic causes: Secondary | ICD-10-CM | POA: Diagnosis not present

## 2023-02-03 NOTE — Progress Notes (Signed)
Surgery orders requested via Epic inbox. °

## 2023-02-06 NOTE — Progress Notes (Signed)
Second request for pre op orders: Spoke with [employer].

## 2023-02-07 NOTE — Patient Instructions (Signed)
DUE TO COVID-19 ONLY TWO VISITORS  (aged 67 and older)  ARE ALLOWED TO COME WITH YOU AND STAY IN THE WAITING ROOM ONLY DURING PRE OP AND PROCEDURE.   **NO VISITORS ARE ALLOWED IN THE SHORT STAY AREA OR RECOVERY ROOM!!**  IF YOU WILL BE ADMITTED INTO THE HOSPITAL YOU ARE ALLOWED ONLY FOUR SUPPORT PEOPLE DURING VISITATION HOURS ONLY (7 AM -8PM)   The support person(s) must pass our screening, gel in and out, and wear a mask at all times, including in the patient's room. Patients must also wear a mask when staff or their support person are in the room. Visitors GUEST BADGE MUST BE WORN VISIBLY  One adult visitor may remain with you overnight and MUST be in the room by 8 P.M.     Your procedure is scheduled on: 02/17/23   Report to [facility] Main Entrance    Report to admitting at : 6:45 AM   Call this number if you have problems the morning of surgery [phone]   Do not eat food :After Midnight.   After Midnight you may have the following liquids until : 6:00 AM DAY OF SURGERY  Water Black Coffee (sugar ok, NO MILK/CREAM OR CREAMERS)  Tea (sugar ok, NO MILK/CREAM OR CREAMERS) regular and decaf                             Plain Jell-O (NO RED)                                           Fruit ices (not with fruit pulp, NO RED)                                     Popsicles (NO RED)                                                                  Juice: apple, WHITE grape, WHITE cranberry Sports drinks like Gatorade (NO RED)              FOLLOW ANY ADDITIONAL PRE OP INSTRUCTIONS YOU RECEIVED FROM YOUR SURGEON'S OFFICE!!!   Oral Hygiene is also important to reduce your risk of infection.                                    Remember - BRUSH YOUR TEETH THE MORNING OF SURGERY WITH YOUR REGULAR TOOTHPASTE  DENTURES WILL BE REMOVED PRIOR TO SURGERY PLEASE DO NOT APPLY "Poly grip" OR ADHESIVES!!!   Do NOT smoke after Midnight   Take these medicines the morning of surgery with A  SIP OF WATER: alprazolam,amlodipine.                              You may not have any metal on your body including hair pins, jewelry, and body piercing  Do not wear make-up, lotions, powders, perfumes/cologne, or deodorant  Do not wear nail polish including gel and S&S, artificial/acrylic nails, or any other type of covering on natural nails including finger and toenails. If you have artificial nails, gel coating, etc. that needs to be removed by a nail salon please have this removed prior to surgery or surgery may need to be canceled/ delayed if the surgeon/ anesthesia feels like they are unable to be safely monitored.   Do not shave  48 hours prior to surgery.    Do not bring valuables to the hospital. [facility] IS NOT             RESPONSIBLE   FOR VALUABLES.   Contacts, glasses, or bridgework may not be worn into surgery.   Bring small overnight bag day of surgery.   DO NOT BRING YOUR HOME MEDICATIONS TO THE HOSPITAL. PHARMACY WILL DISPENSE MEDICATIONS LISTED ON YOUR MEDICATION LIST TO YOU DURING YOUR ADMISSION IN THE HOSPITAL!    Patients discharged on the day of surgery will not be allowed to drive home.  Someone NEEDS to stay with you for the first 24 hours after anesthesia.   Special Instructions: Bring a copy of your healthcare power of attorney and living will documents         the day of surgery if you haven't scanned them before.              Please read over the following fact sheets you were given: IF YOU HAVE QUESTIONS ABOUT YOUR PRE-OP INSTRUCTIONS PLEASE CALL [phone]    [facility] - Preparing for Surgery Before surgery, you can play an important role.  Because skin is not sterile, your skin needs to be as free of germs as possible.  You can reduce the number of germs on your skin by washing with CHG (chlorahexidine gluconate) soap before surgery.  CHG is an antiseptic cleaner which kills germs and bonds with the skin to continue killing germs even after  washing. Please DO NOT use if you have an allergy to CHG or antibacterial soaps.  If your skin becomes reddened/irritated stop using the CHG and inform your nurse when you arrive at Short Stay. Do not shave (including legs and underarms) for at least 48 hours prior to the first CHG shower.  You may shave your face/neck. Please follow these instructions carefully:  1.  Shower with CHG Soap the night before surgery and the  morning of Surgery.  2.  If you choose to wash your hair, wash your hair first as usual with your  normal  shampoo.  3.  After you shampoo, rinse your hair and body thoroughly to remove the  shampoo.                           4.  Use CHG as you would any other liquid soap.  You can apply chg directly  to the skin and wash                       Gently with a scrungie or clean washcloth.  5.  Apply the CHG Soap to your body ONLY FROM THE NECK DOWN.   Do not use on face/ open                           Wound or open sores. Avoid contact with eyes,  ears mouth and genitals (private parts).                       Wash face,  Genitals (private parts) with your normal soap.             6.  Wash thoroughly, paying special attention to the area where your surgery  will be performed.  7.  Thoroughly rinse your body with warm water from the neck down.  8.  DO NOT shower/wash with your normal soap after using and rinsing off  the CHG Soap.                9.  Pat yourself dry with a clean towel.            10.  Wear clean pajamas.            11.  Place clean sheets on your bed the night of your first shower and do not  sleep with pets. Day of Surgery : Do not apply any lotions/deodorants the morning of surgery.  Please wear clean clothes to the hospital/surgery center.  FAILURE TO FOLLOW THESE INSTRUCTIONS MAY RESULT IN THE CANCELLATION OF YOUR SURGERY PATIENT SIGNATURE_________________________________  NURSE  SIGNATURE__________________________________  ________________________________________________________________________

## 2023-02-10 ENCOUNTER — Encounter (HOSPITAL_COMMUNITY): Payer: Self-pay

## 2023-02-10 ENCOUNTER — Encounter (HOSPITAL_COMMUNITY)
Admission: RE | Admit: 2023-02-10 | Discharge: 2023-02-10 | Disposition: A | Discharge status: Home / Self Care | Payer: Medicare Other | Source: Ambulatory Visit | Attending: Orthopaedic Surgery | Admitting: Orthopaedic Surgery

## 2023-02-10 ENCOUNTER — Other Ambulatory Visit: Payer: Self-pay

## 2023-02-10 VITALS — BP 128/80 | HR 68 | Temp 97.9°F | Ht 65.0 in | Wt 148.0 lb

## 2023-02-10 DIAGNOSIS — Z01818 Encounter for other preprocedural examination: Secondary | ICD-10-CM | POA: Insufficient documentation

## 2023-02-10 DIAGNOSIS — I10 Essential (primary) hypertension: Secondary | ICD-10-CM | POA: Diagnosis not present

## 2023-02-10 HISTORY — DX: Pneumonia, unspecified organism: J18.9

## 2023-02-10 HISTORY — DX: Unspecified osteoarthritis, unspecified site: M19.90

## 2023-02-10 HISTORY — DX: Anemia, unspecified: D64.9

## 2023-02-10 LAB — CBC
HCT: 41.8 % (ref 36.0–46.0)
Hemoglobin: 13.6 g/dL (ref 12.0–15.0)
MCH: 30.2 pg (ref 26.0–34.0)
MCHC: 32.5 g/dL (ref 30.0–36.0)
MCV: 92.7 fL (ref 80.0–100.0)
Platelets: 373 10*3/uL (ref 150–400)
RBC: 4.51 MIL/uL (ref 3.87–5.11)
RDW: 14.1 % (ref 11.5–15.5)
WBC: 9.1 10*3/uL (ref 4.0–10.5)
nRBC: 0 % (ref 0.0–0.2)

## 2023-02-10 LAB — BASIC METABOLIC PANEL
Anion gap: 10 (ref 5–15)
BUN: 21 mg/dL (ref 8–23)
CO2: 23 mmol/L (ref 22–32)
Calcium: 10.4 mg/dL — ABNORMAL HIGH (ref 8.9–10.3)
Chloride: 104 mmol/L (ref 98–111)
Creatinine, Ser: 0.76 mg/dL (ref 0.44–1.00)
GFR, Estimated: >60 mL/min (ref >60)
Glucose, Bld: 97 mg/dL (ref 70–99)
Potassium: 4.1 mmol/L (ref 3.5–5.1)
Sodium: 137 mmol/L (ref 135–145)

## 2023-02-10 NOTE — Progress Notes (Signed)
For Anesthesia: PCP - Willow Ora, MD. LOV: 01/15/23 Cardiologist - N/A  Bowel Prep reminder:  Chest x-ray -  EKG - 02/10/23 Stress Test -  ECHO -  Cardiac Cath -  Pacemaker/ICD device last checked: Pacemaker orders received: Device Rep notified:  Spinal Cord Stimulator:  Sleep Study - N/A CPAP -   Fasting Blood Sugar - N/A Checks Blood Sugar _____ times a day Date and result of last Hgb A1c-  Last dose of GLP1 agonist- N/A GLP1 instructions:   Last dose of SGLT-2 inhibitors- N/A SGLT-2 instructions:   Blood Thinner Instructions:N/A Aspirin Instructions: Last Dose:  Activity level: Can go up a flight of stairs and activities of daily living without stopping and without chest pain and/or shortness of breath   Able to exercise without chest pain and/or shortness of breath  Anesthesia review: Hx: HTN  Patient denies shortness of breath, fever, cough and chest pain at PAT appointment   Patient verbalized understanding of instructions that were given to them at the PAT appointment. Patient was also instructed that they will need to review over the PAT instructions again at home before surgery.

## 2023-02-12 ENCOUNTER — Other Ambulatory Visit: Payer: Self-pay

## 2023-02-12 ENCOUNTER — Encounter (HOSPITAL_BASED_OUTPATIENT_CLINIC_OR_DEPARTMENT_OTHER): Payer: Self-pay | Admitting: Orthopaedic Surgery

## 2023-02-18 NOTE — Discharge Instructions (Signed)

## 2023-02-18 NOTE — H&P (Signed)
ORTHOPAEDIC SURGERY H&P  Subjective:  The patient presents with left hallux rigidus.   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   Panic attack as reaction to stress 11/27/2015    Past Surgical History:  Procedure Laterality Date   APPENDECTOMY     BREAST BIOPSY Left over 10 yrs ago   benign   COLONOSCOPY  2007   LUMBAR DISC SURGERY  1998   TOOTH EXTRACTION       (Not in an outpatient encounter)    Allergies  Allergen Reactions   Atorvastatin Other (See Comments)    Severe myalgias   Tramadol Palpitations    Social History   Socioeconomic History   Marital status: Widowed    Spouse name: Not on file   Number of children: Not on file   Years of education: Not on file   Highest education level: Not on file  Occupational History   Not on file  Tobacco Use   Smoking status: Former    Types: Cigarettes   Smokeless tobacco: Never  Vaping Use   Vaping status: Never Used  Substance and Sexual Activity   Alcohol use: Yes    Comment: occas.   Drug use: No   Sexual activity: Not Currently    Birth control/protection: Post-menopausal  Other Topics Concern   Not on file  Social History Narrative   Not on file   Social Drivers of Health   Financial Resource Strain: Low Risk  (05/30/2022)   Overall Financial Resource Strain (CARDIA)    Difficulty of Paying Living Expenses: Not hard at all  Food Insecurity: No Food Insecurity (05/30/2022)   Hunger Vital Sign    Worried About Running Out of Food in the Last Year: Never true    Ran Out of Food in the Last Year: Never true  Transportation Needs: No Transportation Needs (05/30/2022)   PRAPARE - Administrator, Civil Service (Medical): No    Lack of Transportation (Non-Medical): No  Physical Activity: Insufficiently Active (05/30/2022)   Exercise Vital Sign     Days of Exercise per Week: 3 days    Minutes of Exercise per Session: 30 min  Stress: No Stress Concern Present (05/30/2022)   [employer] of Occupational Health - Occupational Stress Questionnaire    Feeling of Stress : Not at all  Social Connections: Moderately Isolated (05/30/2022)   Social Connection and Isolation Panel [NHANES]    Frequency of Communication with Friends and Family: More than three times a week    Frequency of Social Gatherings with Friends and Family: More than three times a week    Attends Religious Services: Never    Database administrator or Organizations: Yes    Attends Banker Meetings: 1 to 4 times per year    Marital Status: Widowed  Intimate Partner Violence: Not At Risk (05/30/2022)   Humiliation, Afraid, Rape, and Kick questionnaire    Fear of Current or Ex-Partner: No    Emotionally Abused: No    Physically Abused: No    Sexually Abused: No     History reviewed. No pertinent family history.   Review of Systems Pertinent items are noted in HPI.  Objective: Vital signs in last 24 hours:    02/12/2023    3:35 PM 02/10/2023  1:41 PM 01/15/2023    2:47 PM  Vitals with BMI  Height 5\' 5"  5\' 5"  5\' 5"   Weight 150 lbs 148 lbs 153 lbs  BMI 24.96 24.63 25.46  Systolic  128   Diastolic  80   Pulse  68       EXAM: General: Well nourished, well developed. Awake, alert and oriented to time, place, person. Normal mood and affect. No apparent distress. Breathing room air.  Operative Lower Extremity: Alignment - Neutral Deformity - None Skin intact Tenderness to palpation - left 1st MTP 5/5 TA, PT, GS, Per, EHL, FHL Sensation intact to light touch throughout Palpable DP and PT pulses Special testing: None  The contralateral foot/ankle was examined for comparison and noted to be neurovascularly intact with no localized deformity, swelling, or tenderness.  Imaging Review All images taken were independently reviewed by  me.  Assessment/Plan: The clinical and radiographic findings were reviewed and discussed at length with the patient.  The patient has left hallux rigidus.  We spoke at length about the natural course of these findings. We discussed nonoperative and operative treatment options in detail.  The risks and benefits were presented and reviewed. The risks due to hardware failure/irritation, new/persistent/recurrent infection, stiffness, nerve/vessel/tendon injury, nonunion/malunion of any fracture, wound healing issues, allograft usage, development of arthritis, failure of this surgery, possibility of external fixation in certain situations, possibility of delayed definitive surgery, need for further surgery, prolonged wound care including further soft tissue coverage procedures, thromboembolic events, anesthesia/medical complications/events perioperatively and beyond, amputation, death among others were discussed. The patient acknowledged the explanation and agreed to proceed with the plan.  Netta Cedars  Orthopaedic Surgery [facility]

## 2023-02-19 ENCOUNTER — Ambulatory Visit (HOSPITAL_COMMUNITY): Payer: Medicare Other

## 2023-02-19 ENCOUNTER — Encounter (HOSPITAL_BASED_OUTPATIENT_CLINIC_OR_DEPARTMENT_OTHER)
Admission: RE | Disposition: A | Discharge status: Home / Self Care | Payer: Self-pay | Source: Ambulatory Visit | Attending: Orthopaedic Surgery

## 2023-02-19 ENCOUNTER — Encounter (HOSPITAL_BASED_OUTPATIENT_CLINIC_OR_DEPARTMENT_OTHER): Payer: Self-pay | Admitting: Orthopaedic Surgery

## 2023-02-19 ENCOUNTER — Ambulatory Visit (HOSPITAL_BASED_OUTPATIENT_CLINIC_OR_DEPARTMENT_OTHER)
Admission: RE | Admit: 2023-02-19 | Discharge: 2023-02-19 | Disposition: A | Discharge status: Home / Self Care | Payer: Medicare Other | Source: Ambulatory Visit | Attending: Orthopaedic Surgery | Admitting: Orthopaedic Surgery

## 2023-02-19 ENCOUNTER — Ambulatory Visit (HOSPITAL_BASED_OUTPATIENT_CLINIC_OR_DEPARTMENT_OTHER): Payer: Medicare Other | Admitting: Anesthesiology

## 2023-02-19 ENCOUNTER — Ambulatory Visit (HOSPITAL_BASED_OUTPATIENT_CLINIC_OR_DEPARTMENT_OTHER): Payer: Self-pay | Admitting: Anesthesiology

## 2023-02-19 ENCOUNTER — Other Ambulatory Visit: Payer: Self-pay

## 2023-02-19 DIAGNOSIS — G8918 Other acute postprocedural pain: Secondary | ICD-10-CM | POA: Diagnosis not present

## 2023-02-19 DIAGNOSIS — M1990 Unspecified osteoarthritis, unspecified site: Secondary | ICD-10-CM | POA: Diagnosis not present

## 2023-02-19 DIAGNOSIS — F419 Anxiety disorder, unspecified: Secondary | ICD-10-CM | POA: Insufficient documentation

## 2023-02-19 DIAGNOSIS — M2022 Hallux rigidus, left foot: Secondary | ICD-10-CM

## 2023-02-19 DIAGNOSIS — I10 Essential (primary) hypertension: Secondary | ICD-10-CM | POA: Diagnosis not present

## 2023-02-19 DIAGNOSIS — Z87891 Personal history of nicotine dependence: Secondary | ICD-10-CM | POA: Insufficient documentation

## 2023-02-19 DIAGNOSIS — M19072 Primary osteoarthritis, left ankle and foot: Secondary | ICD-10-CM | POA: Diagnosis not present

## 2023-02-19 DIAGNOSIS — E782 Mixed hyperlipidemia: Secondary | ICD-10-CM | POA: Diagnosis not present

## 2023-02-19 DIAGNOSIS — Z01818 Encounter for other preprocedural examination: Secondary | ICD-10-CM

## 2023-02-19 HISTORY — PX: ARTHRODESIS METATARSALPHALANGEAL JOINT (MTPJ): SHX6566

## 2023-02-19 SURGERY — FUSION, JOINT, GREAT TOE
Anesthesia: General | Site: Toe | Laterality: Left

## 2023-02-19 MED ORDER — BUPIVACAINE-EPINEPHRINE (PF) 0.5% -1:200000 IJ SOLN
INTRAMUSCULAR | Status: DC | PRN
  Administered 2023-02-19: 20 mL via PERINEURAL

## 2023-02-19 MED ORDER — MIDAZOLAM HCL 2 MG/2ML IJ SOLN
2.0000 mg | Freq: Once | INTRAMUSCULAR | Status: AC
  Administered 2023-02-19: 2 mg via INTRAVENOUS

## 2023-02-19 MED ORDER — DEXAMETHASONE SODIUM PHOSPHATE 10 MG/ML IJ SOLN
INTRAMUSCULAR | Status: AC
Start: 2023-02-19 — End: ?
  Filled 2023-02-19: qty 1

## 2023-02-19 MED ORDER — ACETAMINOPHEN 10 MG/ML IV SOLN
1000.0000 mg | Freq: Once | INTRAVENOUS | Status: DC | PRN
Start: 2023-02-19 — End: 2023-02-19
  Administered 2023-02-19: 1000 mg via INTRAVENOUS

## 2023-02-19 MED ORDER — 0.9 % SODIUM CHLORIDE (POUR BTL) OPTIME
TOPICAL | Status: DC | PRN
  Administered 2023-02-19: 300 mL

## 2023-02-19 MED ORDER — SODIUM CHLORIDE 0.9 % IV SOLN: INTRAVENOUS | Status: DC | PRN

## 2023-02-19 MED ORDER — OXYCODONE HCL 5 MG PO TABS
ORAL_TABLET | ORAL | Status: AC
  Filled 2023-02-19: qty 1

## 2023-02-19 MED ORDER — ACETAMINOPHEN 325 MG PO TABS: 325.0000 mg | ORAL_TABLET | ORAL | Status: DC | PRN

## 2023-02-19 MED ORDER — PROPOFOL 10 MG/ML IV BOLUS
INTRAVENOUS | Status: DC | PRN
  Administered 2023-02-19: 200 mg via INTRAVENOUS
  Administered 2023-02-19: 100 mg via INTRAVENOUS

## 2023-02-19 MED ORDER — FENTANYL CITRATE (PF) 100 MCG/2ML IJ SOLN
INTRAMUSCULAR | Status: AC
  Filled 2023-02-19: qty 2

## 2023-02-19 MED ORDER — FENTANYL CITRATE (PF) 100 MCG/2ML IJ SOLN
100.0000 ug | Freq: Once | INTRAMUSCULAR | Status: AC
  Administered 2023-02-19: 50 ug via INTRAVENOUS

## 2023-02-19 MED ORDER — CHLORHEXIDINE GLUCONATE 4 % EX SOLN: 60.0000 mL | Freq: Once | CUTANEOUS | Status: DC

## 2023-02-19 MED ORDER — EPHEDRINE 5 MG/ML INJ
INTRAVENOUS | Status: AC
  Filled 2023-02-19: qty 5

## 2023-02-19 MED ORDER — ONDANSETRON HCL 4 MG/2ML IJ SOLN
INTRAMUSCULAR | Status: AC
Start: 2023-02-19 — End: ?
  Filled 2023-02-19: qty 2

## 2023-02-19 MED ORDER — ROPIVACAINE HCL 5 MG/ML IJ SOLN
INTRAMUSCULAR | Status: DC | PRN
  Administered 2023-02-19: 20 mL via PERINEURAL

## 2023-02-19 MED ORDER — CEFAZOLIN SODIUM-DEXTROSE 2-4 GM/100ML-% IV SOLN
INTRAVENOUS | Status: AC
Start: 2023-02-19 — End: ?
  Filled 2023-02-19: qty 100

## 2023-02-19 MED ORDER — MIDAZOLAM HCL 2 MG/2ML IJ SOLN
INTRAMUSCULAR | Status: AC
  Filled 2023-02-19: qty 2

## 2023-02-19 MED ORDER — LIDOCAINE 2% (20 MG/ML) 5 ML SYRINGE
INTRAMUSCULAR | Status: DC | PRN
  Administered 2023-02-19: 20 mg via INTRAVENOUS

## 2023-02-19 MED ORDER — FENTANYL CITRATE (PF) 100 MCG/2ML IJ SOLN
25.0000 ug | INTRAMUSCULAR | Status: DC | PRN
  Administered 2023-02-19: 50 ug via INTRAVENOUS

## 2023-02-19 MED ORDER — PHENYLEPHRINE 80 MCG/ML (10ML) SYRINGE FOR IV PUSH (FOR BLOOD PRESSURE SUPPORT)
PREFILLED_SYRINGE | INTRAVENOUS | Status: DC | PRN
  Administered 2023-02-19 (×2): 80 ug via INTRAVENOUS

## 2023-02-19 MED ORDER — ORAL CARE MOUTH RINSE: 15.0000 mL | Freq: Once | OROMUCOSAL | Status: DC

## 2023-02-19 MED ORDER — EPHEDRINE SULFATE-NACL 50-0.9 MG/10ML-% IV SOSY
PREFILLED_SYRINGE | INTRAVENOUS | Status: DC | PRN
  Administered 2023-02-19 (×2): 10 mg via INTRAVENOUS
  Administered 2023-02-19: 5 mg via INTRAVENOUS

## 2023-02-19 MED ORDER — VANCOMYCIN HCL 500 MG IV SOLR
INTRAVENOUS | Status: DC | PRN
  Administered 2023-02-19: 500 mg via TOPICAL

## 2023-02-19 MED ORDER — PROPOFOL 500 MG/50ML IV EMUL
INTRAVENOUS | Status: AC
Start: 2023-02-19 — End: ?
  Filled 2023-02-19: qty 50

## 2023-02-19 MED ORDER — OXYCODONE HCL 5 MG PO TABS
5.0000 mg | ORAL_TABLET | Freq: Once | ORAL | Status: AC | PRN
Start: 2023-02-19 — End: 2023-02-19
  Administered 2023-02-19: 5 mg via ORAL

## 2023-02-19 MED ORDER — SCOPOLAMINE 1 MG/3DAYS TD PT72
MEDICATED_PATCH | TRANSDERMAL | Status: AC
  Filled 2023-02-19: qty 1

## 2023-02-19 MED ORDER — ATROPINE SULFATE 0.4 MG/ML IV SOLN
INTRAVENOUS | Status: AC
  Filled 2023-02-19: qty 1

## 2023-02-19 MED ORDER — CEFAZOLIN SODIUM-DEXTROSE 2-4 GM/100ML-% IV SOLN
2.0000 g | INTRAVENOUS | Status: AC
  Administered 2023-02-19: 2 g via INTRAVENOUS

## 2023-02-19 MED ORDER — LIDOCAINE 2% (20 MG/ML) 5 ML SYRINGE
INTRAMUSCULAR | Status: AC
  Filled 2023-02-19: qty 5

## 2023-02-19 MED ORDER — DROPERIDOL 2.5 MG/ML IJ SOLN: 0.6250 mg | Freq: Once | INTRAMUSCULAR | Status: DC | PRN

## 2023-02-19 MED ORDER — LIDOCAINE 2% (20 MG/ML) 5 ML SYRINGE
INTRAMUSCULAR | Status: AC
Start: 2023-02-19 — End: ?
  Filled 2023-02-19: qty 5

## 2023-02-19 MED ORDER — POVIDONE-IODINE 10 % EX SOLN
CUTANEOUS | Status: DC | PRN
  Administered 2023-02-19: 1 via TOPICAL

## 2023-02-19 MED ORDER — ONDANSETRON HCL 4 MG/2ML IJ SOLN
INTRAMUSCULAR | Status: DC | PRN
  Administered 2023-02-19: 4 mg via INTRAVENOUS

## 2023-02-19 MED ORDER — LACTATED RINGERS IV SOLN: INTRAVENOUS | Status: DC

## 2023-02-19 MED ORDER — CHLORHEXIDINE GLUCONATE 0.12 % MT SOLN: 15.0000 mL | Freq: Once | OROMUCOSAL | Status: DC

## 2023-02-19 MED ORDER — PHENYLEPHRINE 80 MCG/ML (10ML) SYRINGE FOR IV PUSH (FOR BLOOD PRESSURE SUPPORT)
PREFILLED_SYRINGE | INTRAVENOUS | Status: AC
  Filled 2023-02-19: qty 10

## 2023-02-19 MED ORDER — DEXAMETHASONE SODIUM PHOSPHATE 10 MG/ML IJ SOLN
INTRAMUSCULAR | Status: DC | PRN
  Administered 2023-02-19: 5 mg via INTRAVENOUS

## 2023-02-19 MED ORDER — OXYCODONE HCL 5 MG/5ML PO SOLN: 5.0000 mg | Freq: Once | ORAL | Status: AC | PRN

## 2023-02-19 MED ORDER — ACETAMINOPHEN 10 MG/ML IV SOLN
INTRAVENOUS | Status: AC
  Filled 2023-02-19: qty 100

## 2023-02-19 MED ORDER — ACETAMINOPHEN 160 MG/5ML PO SOLN: 325.0000 mg | ORAL | Status: DC | PRN

## 2023-02-19 MED ORDER — LACTATED RINGERS IV SOLN
INTRAVENOUS | Status: DC
Start: 2023-02-19 — End: 2023-02-19

## 2023-02-19 SURGICAL SUPPLY — 66 items
BANDAGE ESMARK 6X9 LF (GAUZE/BANDAGES/DRESSINGS) ×1 IMPLANT
BIT DRILL 2.6XCANN 4XSCR (BIT) IMPLANT
BIT DRILL 24X2XDISP ORTHOLOC (BIT) IMPLANT
BIT DRILL 60X2.5XDISP (BIT) IMPLANT
BIT DRL 2.6XCANN 4XSCR (BIT) ×1 IMPLANT
BIT DRL 24X2XDISP ORTHOLOC (BIT) ×1 IMPLANT
BIT DRL 60X2.5XDISP (BIT) ×1 IMPLANT
BLADE AVERAGE 25X9 (BLADE) IMPLANT
BLADE SURG 15 STRL LF DISP TIS (BLADE) ×2 IMPLANT
BNDG COHESIVE 4X5 TAN STRL LF (GAUZE/BANDAGES/DRESSINGS) ×1 IMPLANT
BNDG ELASTIC 4INX 5YD STR LF (GAUZE/BANDAGES/DRESSINGS) ×1 IMPLANT
BNDG ELASTIC 6INX 5YD STR LF (GAUZE/BANDAGES/DRESSINGS) ×1 IMPLANT
BNDG ELASTIC 6X10 VLCR STRL LF (GAUZE/BANDAGES/DRESSINGS) IMPLANT
BNDG ESMARK 6X9 LF (GAUZE/BANDAGES/DRESSINGS) ×1 IMPLANT
BNDG GAUZE DERMACEA FLUFF 4 (GAUZE/BANDAGES/DRESSINGS) ×1 IMPLANT
CANISTER SUCT 1200ML W/VALVE (MISCELLANEOUS) ×1 IMPLANT
CHLORAPREP W/TINT 26 (MISCELLANEOUS) ×1 IMPLANT
COVER BACK TABLE 60X90IN (DRAPES) ×1 IMPLANT
CUFF TRNQT CYL 34X4.125X (TOURNIQUET CUFF) ×1 IMPLANT
DRAPE C-ARM 42X72 X-RAY (DRAPES) ×1 IMPLANT
DRAPE C-ARMOR (DRAPES) ×1 IMPLANT
DRAPE EXTREMITY T 121X128X90 (DISPOSABLE) ×1 IMPLANT
DRAPE IMP U-DRAPE 54X76 (DRAPES) ×1 IMPLANT
DRAPE U-SHAPE 47X51 STRL (DRAPES) ×1 IMPLANT
DRSG MEPITEL 4X7.2 (GAUZE/BANDAGES/DRESSINGS) ×1 IMPLANT
ELECT REM PT RETURN 9FT ADLT (ELECTROSURGICAL) ×1 IMPLANT
ELECTRODE REM PT RTRN 9FT ADLT (ELECTROSURGICAL) ×1 IMPLANT
GAUZE PAD ABD 8X10 STRL (GAUZE/BANDAGES/DRESSINGS) IMPLANT
GAUZE SPONGE 4X4 12PLY STRL (GAUZE/BANDAGES/DRESSINGS) ×1 IMPLANT
GAUZE XEROFORM 1X8 LF (GAUZE/BANDAGES/DRESSINGS) ×1 IMPLANT
GLOVE BIOGEL PI IND STRL 8 (GLOVE) ×1 IMPLANT
GLOVE SURG SS PI 7.5 STRL IVOR (GLOVE) ×1 IMPLANT
GOWN STRL REUS W/ TWL LRG LVL3 (GOWN DISPOSABLE) ×2 IMPLANT
K-WIRE NONTHRD 1.2 (WIRE) ×3 IMPLANT
KWIRE NONTHRD 1.2 (WIRE) IMPLANT
NDL HYPO 22X1.5 SAFETY MO (MISCELLANEOUS) IMPLANT
NEEDLE HYPO 22X1.5 SAFETY MO (MISCELLANEOUS) IMPLANT
NS IRRIG 1000ML POUR BTL (IV SOLUTION) ×1 IMPLANT
PACK BASIN DAY SURGERY FS (CUSTOM PROCEDURE TRAY) ×1 IMPLANT
PAD CAST 4YDX4 CTTN HI CHSV (CAST SUPPLIES) ×1 IMPLANT
PADDING CAST ABS COTTON 4X4 ST (CAST SUPPLIES) IMPLANT
PADDING CAST COTTON 6X4 STRL (CAST SUPPLIES) IMPLANT
PENCIL SMOKE EVACUATOR (MISCELLANEOUS) ×1 IMPLANT
PIN TEMP (PIN) IMPLANT
PLATE MTP LT 0D SM (Plate) IMPLANT
SCREW 3.5X 16 NON LOCK (Screw) IMPLANT
SCREW CANN HDLS 4X32 (Screw) IMPLANT
SCREW CORT NLOCK FT ST 2.7X14 (Screw) IMPLANT
SCREW LOCK FT 12X2.7XST PA (Screw) IMPLANT
SCREW LOCK FT 16X3.5XST PA (Screw) IMPLANT
SHEET MEDIUM DRAPE 40X70 STRL (DRAPES) ×1 IMPLANT
SLEEVE SCD COMPRESS KNEE MED (STOCKING) ×1 IMPLANT
SPIKE FLUID TRANSFER (MISCELLANEOUS) IMPLANT
SPLINT FIBERGLASS 4X30 (CAST SUPPLIES) IMPLANT
SPONGE T-LAP 18X18 ~~LOC~~+RFID (SPONGE) ×1 IMPLANT
STOCKINETTE 6 STRL (DRAPES) ×1 IMPLANT
SUCTION TUBE FRAZIER 10FR DISP (SUCTIONS) ×1 IMPLANT
SUT ETHILON 2 0 FS 18 (SUTURE) ×1 IMPLANT
SUT FIBERWIRE #2 38 T-5 BLUE (SUTURE) IMPLANT
SUT MNCRL AB 3-0 PS2 18 (SUTURE) IMPLANT
SUT VIC AB 2-0 SH 27XBRD (SUTURE) ×1 IMPLANT
SUT VICRYL 0 SH 27 (SUTURE) IMPLANT
SUTURE FIBERWR #2 38 T-5 BLUE (SUTURE) IMPLANT
SYR BULB EAR ULCER 3OZ GRN STR (SYRINGE) ×1 IMPLANT
TOWEL GREEN STERILE FF (TOWEL DISPOSABLE) ×2 IMPLANT
UNDERPAD 30X36 HEAVY ABSORB (UNDERPADS AND DIAPERS) ×1 IMPLANT

## 2023-02-19 NOTE — Transfer of Care (Signed)
Immediate Anesthesia Transfer of Care Note  Patient: Tomasina A Sample  Procedure(s) Performed: LEFT HALLUX ARTHRODESIS METATARSALPHALANGEAL JOINT (MTPJ) (Left: Toe)  Patient Location: PACU  Anesthesia Type:General  Level of Consciousness: drowsy  Airway & Oxygen Therapy: Patient Spontanous Breathing and Patient connected to face mask oxygen  Post-op Assessment: Report given to RN and Post -op Vital signs reviewed and stable  Post vital signs: Reviewed and stable  Last Vitals:  Vitals Value Taken Time  BP 114/60 02/19/23 1343  Temp    Pulse 82 02/19/23 1345  Resp 13 02/19/23 1345  SpO2 98 % 02/19/23 1345  Vitals shown include unfiled device data.  Last Pain:  Vitals:   02/19/23 1150  TempSrc: Oral  PainSc: 0-No pain      Patients Stated Pain Goal: 8 (02/19/23 1150)  Complications: No notable events documented.

## 2023-02-19 NOTE — Anesthesia Procedure Notes (Signed)
Procedure Name: LMA Insertion Date/Time: 02/19/2023 12:34 PM  Performed by: Roosvelt Harps, CRNAPre-anesthesia Checklist: Patient identified, Emergency Drugs available, Suction available and Patient being monitored Patient Re-evaluated:Patient Re-evaluated prior to induction Oxygen Delivery Method: Circle System Utilized Preoxygenation: Pre-oxygenation with 100% oxygen Induction Type: IV induction Ventilation: Mask ventilation without difficulty LMA: LMA inserted LMA Size: 4.0 Number of attempts: 1 Placement Confirmation: positive ETCO2 and breath sounds checked- equal and bilateral Tube secured with: Tape Dental Injury: Teeth and Oropharynx as per pre-operative assessment

## 2023-02-19 NOTE — Op Note (Signed)
02/19/2023  1:46 PM   PATIENT: Ashley Duffy  67 y.o. female  MRN: [id number]   PRE-OPERATIVE DIAGNOSIS:   Acquired left hallux rigidus   POST-OPERATIVE DIAGNOSIS:   Acquired left hallux rigidus   PROCEDURE: LEFT HALLUX ARTHRODESIS FIRST METATARSOPHALANGEAL JOINT (MTPJ)   SURGEON:  Netta Cedars, MD   ASSISTANT: None   ANESTHESIA: General, regional   EBL: Minimal   TOURNIQUET:    Total Tourniquet Time Documented: Thigh (Left) - 53 minutes Total: Thigh (Left) - 53 minutes    COMPLICATIONS: None apparent   DISPOSITION: Extubated, awake and stable to recovery.   INDICATION FOR PROCEDURE: The patient presented with above diagnosis.  We discussed the diagnosis, alternative treatment options, risks and benefits of the above surgical intervention, as well as alternative non-operative treatments. All questions/concerns were addressed and the patient/family demonstrated appropriate understanding of the diagnosis, the procedure, the postoperative course, and overall prognosis. The patient wished to proceed with surgical intervention and signed an informed surgical consent as such, in each others presence prior to surgery.   PROCEDURE IN DETAIL: After preoperative consent was obtained and the correct operative site was identified, the patient was brought to the operating room supine on stretcher and transferred onto operating table. General anesthesia was induced. Preoperative antibiotics were administered. Surgical timeout was taken. The patient was then positioned supine with an ipsilateral hip bump. The operative lower extremity was prepped and draped in standard sterile fashion with a tourniquet around the thigh. The extremity was exsanguinated and the tourniquet was inflated to 275 mmHg.  A dorsal approach was made directly over the first metatarsophalangeal joint. This was carried down to the level of the extensor hallucis longus tendon. The capsule was  incised medial to the tendon, and the tendon was protected throughout the procedure. The capsule and surrounding ligaments were released in order to allow full exposure of the first MTP joint. The cartilage remaining on either articular surface of the joint was debrided using curette and rongeur. A guide wire was placed directly into the first metatarsal and appropriate sized reamer was used to prepare the surface. The bone was noted to be exceptionally osteoporotic throughout. This guidewire was then used in the proximal phalanx, and that articular surface was reamed similarly. Both joint surfaces were perforated with a K wire in order to stimulate bone healing across the arthrodesis site. The medial eminence was resected with a saw blade.    A crossing guide wire was placed to secure the arthrodesis in appropriate position. This was verified clinically using foot plate as well as with fluoroscopy. A variable pitch 4.0 mm Stryker DartFire compression screw was implanted over this guidewire using cannulated drill technique achieving excellent compression across the arthrodesis site. We then selected an appropriate sized Stryker OrthoLoc plate and implanted this with a series of locking screws. Footplate and fluoroscopy were used throughout to verify appropriate position of the fusion.   The surgical sites were thoroughly irrigated. The tourniquet was deflated and hemostasis achieved. Betadine and vancomycin powder were applied. The deep layers were closed using 2-0 vicryl. The skin was closed without tension.    The leg was cleaned with saline and sterile dressings with gauze were applied. A well padded bulky short leg splint was applied. The patient was awakened from anesthesia and transported to the recovery room in stable condition.    FOLLOW UP PLAN: -transfer to PACU, then home -strict NWB operative extremity, maximum elevation -maintain short leg splint until follow up -DVT ppx:  Aspirin 81 mg  twice daily while NWB -follow up as outpatient within 7-10 days for wound check with exchange of short leg splint to short leg cast -sutures out in 2-3 weeks in outpatient office   RADIOGRAPHS: AP, lateral, oblique radiographs of the left foot were obtained intraoperatively. These showed interval 1st MTP fusion. All hardware is appropriately positioned and of the appropriate lengths. No acute injuries are noted.   Netta Cedars [facility]

## 2023-02-19 NOTE — Progress Notes (Signed)
Assisted Dr. Smith Robert with left, adductor canal, popliteal, ultrasound guided block. Side rails up, monitors on throughout procedure. See vital signs in flow sheet. Tolerated Procedure well.

## 2023-02-19 NOTE — Anesthesia Postprocedure Evaluation (Addendum)
Anesthesia Post Note  Patient: Baylei A Jaffer  Procedure(s) Performed: LEFT HALLUX ARTHRODESIS METATARSALPHALANGEAL JOINT (MTPJ) (Left: Toe)     Patient location during evaluation: PACU Anesthesia Type: General and Regional Level of consciousness: awake and alert Pain management: pain level controlled Vital Signs Assessment: post-procedure vital signs reviewed and stable Respiratory status: spontaneous breathing, nonlabored ventilation, respiratory function stable and patient connected to nasal cannula oxygen Cardiovascular status: blood pressure returned to baseline and stable Postop Assessment: no apparent nausea or vomiting Anesthetic complications: no  No notable events documented.  Last Vitals:  Vitals:   02/19/23 1345 02/19/23 1400  BP: 113/62 (!) 133/59  Pulse: 82 87  Resp: 13 (!) 24  Temp:    SpO2: 98% 97%                Shelton Silvas

## 2023-02-19 NOTE — Progress Notes (Signed)
Pt states she has crutches at home and comfortable to use them after instruction with dc paperwork. Last dose of oxycodone and tylenol received written on dc paperwork.

## 2023-02-19 NOTE — H&P (Signed)

## 2023-02-19 NOTE — Anesthesia Preprocedure Evaluation (Addendum)
Anesthesia Evaluation  Patient identified by MRN, date of birth, ID band Patient awake    Reviewed: Allergy & Precautions, NPO status , Patient's Chart, lab work & pertinent test results  Airway Mallampati: II  TM Distance: <3 FB Neck ROM: Full    Dental no notable dental hx.    Pulmonary former smoker   Pulmonary exam normal breath sounds clear to auscultation       Cardiovascular hypertension, Pt. on medications Normal cardiovascular exam Rhythm:Regular Rate:Normal     Neuro/Psych  PSYCHIATRIC DISORDERS Anxiety      Neuromuscular disease    GI/Hepatic negative GI ROS, Neg liver ROS,,,  Endo/Other  negative endocrine ROS    Renal/GU negative Renal ROS     Musculoskeletal  (+) Arthritis ,    Abdominal   Peds  Hematology negative hematology ROS (+)   Anesthesia Other Findings   Reproductive/Obstetrics                             Anesthesia Physical Anesthesia Plan  ASA: 3  Anesthesia Plan: General   Post-op Pain Management:    Induction: Intravenous  PONV Risk Score and Plan: Ondansetron, Dexamethasone and Treatment may vary due to age or medical condition  Airway Management Planned: LMA  Additional Equipment:   Intra-op Plan:   Post-operative Plan: Extubation in OR  Informed Consent: I have reviewed the patients History and Physical, chart, labs and discussed the procedure including the risks, benefits and alternatives for the proposed anesthesia with the patient or authorized representative who has indicated his/her understanding and acceptance.     Dental advisory given  Plan Discussed with: CRNA  Anesthesia Plan Comments:         Anesthesia Quick Evaluation

## 2023-02-19 NOTE — Anesthesia Procedure Notes (Signed)
Anesthesia Regional Block: Adductor canal block   Pre-Anesthetic Checklist: , timeout performed,  Correct Patient, Correct Site, Correct Laterality,  Correct Procedure, Correct Position, site marked,  Risks and benefits discussed,  Surgical consent,  Pre-op evaluation,  At surgeon's request and post-op pain management  Laterality: Left  Prep: chloraprep       Needles:  Injection technique: Single-shot  Needle Type: Echogenic Stimulator Needle     Needle Length: 9cm  Needle Gauge: 21     Additional Needles:   Procedures:,,,, ultrasound used (permanent image in chart),,    Narrative:  Start time: 02/19/2023 12:15 PM End time: 02/19/2023 12:20 PM Injection made incrementally with aspirations every 5 mL.  Performed by: Personally  Anesthesiologist: Shelton Silvas, MD  Additional Notes: Discussed risks and benefits of the nerve block in detail, including but not limited vascular injury, permanent nerve damage and infection.   Patient tolerated the procedure well. Local anesthetic introduced in an incremental fashion under minimal resistance after negative aspirations. No paresthesias were elicited. After completion of the procedure, no acute issues were identified and patient continued to be monitored by RN.

## 2023-02-19 NOTE — Anesthesia Procedure Notes (Signed)
Anesthesia Regional Block: Popliteal block   Pre-Anesthetic Checklist: , timeout performed,  Correct Patient, Correct Site, Correct Laterality,  Correct Procedure, Correct Position, site marked,  Risks and benefits discussed,  Surgical consent,  Pre-op evaluation,  At surgeon's request and post-op pain management  Laterality: Left  Prep: chloraprep       Needles:  Injection technique: Single-shot  Needle Type: Echogenic Stimulator Needle     Needle Length: 9cm  Needle Gauge: 21     Additional Needles:   Procedures:,,,, ultrasound used (permanent image in chart),,    Narrative:  Start time: 02/19/2023 12:10 PM End time: 02/19/2023 12:15 PM Injection made incrementally with aspirations every 5 mL.  Performed by: Personally  Anesthesiologist: Shelton Silvas, MD  Additional Notes: Discussed risks and benefits of the nerve block in detail, including but not limited vascular injury, permanent nerve damage and infection.   Patient tolerated the procedure well. Local anesthetic introduced in an incremental fashion under minimal resistance after negative aspirations. No paresthesias were elicited. After completion of the procedure, no acute issues were identified and patient continued to be monitored by RN.

## 2023-02-20 ENCOUNTER — Encounter (HOSPITAL_BASED_OUTPATIENT_CLINIC_OR_DEPARTMENT_OTHER): Payer: Self-pay | Admitting: Orthopaedic Surgery

## 2023-02-28 DIAGNOSIS — N390 Urinary tract infection, site not specified: Secondary | ICD-10-CM | POA: Diagnosis not present

## 2023-03-07 DIAGNOSIS — K080 Exfoliation of teeth due to systemic causes: Secondary | ICD-10-CM | POA: Diagnosis not present

## 2023-03-27 DIAGNOSIS — M2022 Hallux rigidus, left foot: Secondary | ICD-10-CM | POA: Diagnosis not present

## 2023-04-08 DIAGNOSIS — K080 Exfoliation of teeth due to systemic causes: Secondary | ICD-10-CM | POA: Diagnosis not present

## 2023-04-24 DIAGNOSIS — M2022 Hallux rigidus, left foot: Secondary | ICD-10-CM | POA: Diagnosis not present

## 2023-05-23 LAB — HM MAMMOGRAPHY

## 2023-05-27 DIAGNOSIS — Z01419 Encounter for gynecological examination (general) (routine) without abnormal findings: Secondary | ICD-10-CM | POA: Diagnosis not present

## 2023-05-27 DIAGNOSIS — Z6825 Body mass index (BMI) 25.0-25.9, adult: Secondary | ICD-10-CM | POA: Diagnosis not present

## 2023-05-27 DIAGNOSIS — Z1231 Encounter for screening mammogram for malignant neoplasm of breast: Secondary | ICD-10-CM | POA: Diagnosis not present

## 2023-05-27 LAB — HM MAMMOGRAPHY

## 2023-05-28 ENCOUNTER — Other Ambulatory Visit: Payer: Self-pay | Admitting: Obstetrics and Gynecology

## 2023-05-28 DIAGNOSIS — Z803 Family history of malignant neoplasm of breast: Secondary | ICD-10-CM

## 2023-06-05 ENCOUNTER — Ambulatory Visit: Payer: Medicare Other

## 2023-06-05 VITALS — Ht 64.5 in | Wt 148.0 lb

## 2023-06-05 DIAGNOSIS — Z0000 Encounter for general adult medical examination without abnormal findings: Secondary | ICD-10-CM

## 2023-06-05 NOTE — Patient Instructions (Signed)
 Ashley Duffy , Thank you for taking time to come for your Medicare Wellness Visit. I appreciate your ongoing commitment to your health goals. Please review the following plan we discussed and let me know if I can assist you in the future.   Referrals/Orders/Follow-Ups/Clinician Recommendations: maintain health and activity to get back to hiking   This is a list of the screening recommended for you and due dates:  Health Maintenance  Topic Date Due   COVID-19 Vaccine (5 - 2024-25 season) 11/03/2022   Mammogram  05/23/2023   DEXA scan (bone density measurement)  07/03/2023   Flu Shot  10/03/2023   Medicare Annual Wellness Visit  06/04/2024   DTaP/Tdap/Td vaccine (2 - Td or Tdap) 05/25/2025   Colon Cancer Screening  08/07/2027   Pneumonia Vaccine  Completed   Hepatitis C Screening  Completed   Zoster (Shingles) Vaccine  Completed   HPV Vaccine  Aged Out    Advanced directives: (Declined) Advance directive discussed with you today. Even though you declined this today, please call our office should you change your mind, and we can give you the proper paperwork for you to fill out.  Next Medicare Annual Wellness Visit scheduled for next year: Yes

## 2023-06-05 NOTE — Progress Notes (Signed)
 Subjective:   Ashley Duffy is a 68 y.o. who presents for a Medicare Wellness preventive visit.  Visit Complete: Virtual I connected with  Amberlin A Steptoe on 06/05/23 by a audio enabled telemedicine application and verified that I am speaking with the correct person using two identifiers.  Patient Location: Home  Provider Location: Office/Clinic  I discussed the limitations of evaluation and management by telemedicine. The patient expressed understanding and agreed to proceed.  Vital Signs: Because this visit was a virtual/telehealth visit, some criteria may be missing or patient reported. Any vitals not documented were not able to be obtained and vitals that have been documented are patient reported.  VideoDeclined- This patient declined Librarian, academic. Therefore the visit was completed with audio only.  Persons Participating in Visit: Patient.  AWV Questionnaire: No: Patient Medicare AWV questionnaire was not completed prior to this visit.  Cardiac Risk Factors include: advanced age (>70men, >26 women);dyslipidemia;hypertension     Objective:    Today's Vitals   06/05/23 1117  Weight: 148 lb (67.1 kg)  Height: 5' 4.5" (1.638 m)   Body mass index is 25.01 kg/m.     06/05/2023   11:23 AM 02/19/2023   11:45 AM 02/12/2023    3:39 PM 02/10/2023    1:48 PM 05/30/2022   11:34 AM 05/21/2021   11:01 AM 05/07/2021    5:00 PM  Advanced Directives  Does Patient Have a Medical Advance Directive? No Yes Yes Yes Yes Yes No  Type of Best boy of [place];Living will Living will Healthcare Power of [place];Living will Healthcare Power of Attorney   Does patient want to make changes to medical advance directive?  No - Patient declined       Copy of Healthcare Power of Attorney in Chart?     No - copy requested No - copy requested   Would patient like information on creating a medical advance directive? No - Patient declined       No - Patient declined    Current Medications (verified) Outpatient Encounter Medications as of 06/05/2023  Medication Sig   ALPRAZolam (XANAX) 0.5 MG tablet Take 1 tablet (0.5 mg total) by mouth 2 (two) times daily as needed for anxiety.   amLODipine (NORVASC) 10 MG tablet Take 1 tablet (10 mg total) by mouth daily.   Cholecalciferol (VITAMIN D3 PO) Take 1 tablet by mouth daily.   FLUZONE HIGH-DOSE 0.5 ML injection    [DISCONTINUED] phentermine (ADIPEX-P) 37.5 MG tablet Take 1 tablet (37.5 mg total) by mouth daily before breakfast. (Patient taking differently: Take 37.5 mg by mouth daily as needed (pt preference).)   No facility-administered encounter medications on file as of 06/05/2023.    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   Panic attack as reaction to stress 11/27/2015   Past Surgical History:  Procedure Laterality Date   APPENDECTOMY     ARTHRODESIS METATARSALPHALANGEAL JOINT (MTPJ) Left 02/19/2023   Procedure: LEFT HALLUX ARTHRODESIS METATARSALPHALANGEAL JOINT (MTPJ);  Surgeon: Netta Cedars, MD;  Location: [facility];  Service: Orthopedics;  Laterality: Left;  GENERAL AND REGIONAL 90 MIN   BREAST BIOPSY Left over 10 yrs ago   benign   COLONOSCOPY  2007  LUMBAR DISC SURGERY  1998   TOOTH EXTRACTION     Family History  Problem Relation Age of Onset   Alzheimer's disease Mother    Breast cancer Mother 68   Hypertension Mother    Thyroid disease Mother    Alcohol abuse Father    Colon cancer Father    Arthritis Brother    Hypertension Brother    Breast cancer Maternal Aunt 37   Breast cancer Maternal Grandmother 50   Diabetes Neg Hx    Social History   Socioeconomic History   Marital status: Widowed    Spouse name: Not on file   Number  of children: Not on file   Years of education: Not on file   Highest education level: Not on file  Occupational History   Not on file  Tobacco Use   Smoking status: Former    Types: Cigarettes   Smokeless tobacco: Never  Vaping Use   Vaping status: Never Used  Substance and Sexual Activity   Alcohol use: Yes    Comment: occas.   Drug use: No   Sexual activity: Not Currently    Birth control/protection: Post-menopausal  Other Topics Concern   Not on file  Social History Narrative   Not on file   Social Drivers of Health   Financial Resource Strain: Low Risk  (06/05/2023)   Overall Financial Resource Strain (CARDIA)    Difficulty of Paying Living Expenses: Not hard at all  Food Insecurity: No Food Insecurity (06/05/2023)   Hunger Vital Sign    Worried About Running Out of Food in the Last Year: Never true    Ran Out of Food in the Last Year: Never true  Transportation Needs: No Transportation Needs (06/05/2023)   PRAPARE - Administrator, Civil Service (Medical): No    Lack of Transportation (Non-Medical): No  Physical Activity: Insufficiently Active (06/05/2023)   Exercise Vital Sign    Days of Exercise per Week: 3 days    Minutes of Exercise per Session: 30 min  Stress: No Stress Concern Present (06/05/2023)   [employer] of Occupational Health - Occupational Stress Questionnaire    Feeling of Stress : Not at all  Social Connections: Moderately Isolated (06/05/2023)   Social Connection and Isolation Panel [NHANES]    Frequency of Communication with Friends and Family: More than three times a week    Frequency of Social Gatherings with Friends and Family: More than three times a week    Attends Religious Services: Never    Database administrator or Organizations: Yes    Attends Banker Meetings: 1 to 4 times per year    Marital Status: Widowed    Tobacco Counseling Counseling given: Not Answered    Clinical Intake:  Pre-visit preparation  completed: Yes  Pain : No/denies pain     BMI - recorded: 25.01 Nutritional Status: BMI 25 -29 Overweight Nutritional Risks: None Diabetes: No  No results found for: "HGBA1C"   How often do you need to have someone help you when you read instructions, pamphlets, or other written materials from your doctor or pharmacy?: 1 - Never  Interpreter Needed?: No  Information entered by :: Lanier Ensign, LPN   Activities of Daily Living     06/05/2023   11:19 AM 02/19/2023   11:49 AM  In your present state of health, do you have any difficulty performing the following activities:  Hearing? 0 0  Vision? 0 0  Difficulty concentrating  or making decisions? 0 0  Walking or climbing stairs? 0   Dressing or bathing? 0   Doing errands, shopping? 0   Preparing Food and eating ? N   Using the Toilet? N   In the past six months, have you accidently leaked urine? N   Do you have problems with loss of bowel control? N   Managing your Medications? N   Managing your Finances? N   Housekeeping or managing your Housekeeping? N     Patient Care Team: Willow Ora, MD as PCP - General (Family Medicine) Ginette Otto, [facility] as Consulting Physician (Gynecology) Konrad Penta, MD as Consulting Physician (Gastroenterology)  Indicate any recent Medical Services you may have received from other than [facility] providers in the past year (date may be approximate).     Assessment:   This is a routine wellness examination for [place].  Hearing/Vision screen Hearing Screening - Comments:: Pt denies any  hearing  Vision Screening - Comments:: Pt encouraged to follow up with with eye provider    Goals Addressed             This Visit's Progress    Patient Stated       More hiking        Depression Screen     06/05/2023   11:21 AM 01/15/2023    2:48 PM 08/22/2022   10:34 AM 05/30/2022   11:33 AM 02/08/2022   11:06 AM 10/03/2021    3:07 PM 07/31/2021    8:59 AM  PHQ 2/9  Scores  PHQ - 2 Score 0 0 0 0 0 0 0  PHQ- 9 Score      0     Fall Risk     06/05/2023   11:24 AM 01/15/2023    2:48 PM 08/22/2022   10:34 AM 05/30/2022   11:35 AM 02/08/2022   11:06 AM  Fall Risk   Falls in the past year? 0 0 0 0 0  Number falls in past yr: 0 0 0 0 0  Injury with Fall? 0 0 0 0 0  Risk for fall due to : No Fall Risks No Fall Risks No Fall Risks Impaired vision No Fall Risks  Follow up Falls prevention discussed Falls evaluation completed Falls evaluation completed Falls prevention discussed Falls evaluation completed    MEDICARE RISK AT HOME:  Medicare Risk at Home Any stairs in or around the home?: Yes If so, are there any without handrails?: No Home free of loose throw rugs in walkways, pet beds, electrical cords, etc?: Yes Adequate lighting in your home to reduce risk of falls?: Yes Life alert?: No Use of a cane, walker or w/c?: No Grab bars in the bathroom?: No Shower chair or bench in shower?: No Elevated toilet seat or a handicapped toilet?: No  TIMED UP AND GO:  Was the test performed?  No  Cognitive Function: 6CIT completed        06/05/2023   11:25 AM 05/30/2022   11:35 AM 05/21/2021   11:05 AM  6CIT Screen  What Year? 0 points 0 points 0 points  What month? 0 points 0 points 0 points  What time? 0 points 0 points 0 points  Count back from 20 0 points 0 points 0 points  Months in reverse 0 points 0 points 0 points  Repeat phrase 0 points 0 points 0 points  Total Score 0 points 0 points 0 points    Immunizations Immunization History  Administered Date(s) Administered   Fluad Quad(high Dose 65+) 12/03/2021   Influenza, Seasonal, Injecte, Preservative Fre 01/12/2003, 11/27/2015   Influenza,inj,Quad PF,6+ Mos 02/14/2017, 01/23/2018, 12/17/2018   Influenza-Unspecified 12/18/2020, 04/03/2023   PFIZER(Purple Top)SARS-COV-2 Vaccination 05/19/2019, 06/09/2019, 12/22/2019   PNEUMOCOCCAL CONJUGATE-20 07/31/2021   Pfizer Covid Bivalent Pediatric  Vaccine(46mos to <10yrs) 12/20/2020   Pneumococcal Polysaccharide-23 08/06/2012, 07/28/2020   Tdap 05/26/2015   Zoster Recombinant(Shingrix) 01/23/2018, 07/28/2020   Zoster, Live 05/26/2015    Screening Tests Health Maintenance  Topic Date Due   COVID-19 Vaccine (5 - 2024-25 season) 11/03/2022   MAMMOGRAM  05/23/2023   DEXA SCAN  07/03/2023   INFLUENZA VACCINE  10/03/2023   Medicare Annual Wellness (AWV)  06/04/2024   DTaP/Tdap/Td (2 - Td or Tdap) 05/25/2025   Colonoscopy  08/07/2027   Pneumonia Vaccine 66+ Years old  Completed   Hepatitis C Screening  Completed   Zoster Vaccines- Shingrix  Completed   HPV VACCINES  Aged Out    Health Maintenance  Health Maintenance Due  Topic Date Due   COVID-19 Vaccine (5 - 2024-25 season) 11/03/2022   MAMMOGRAM  05/23/2023   Health Maintenance Items Addressed: See Nurse Notes  Additional Screening:  Vision Screening: Recommended annual ophthalmology exams for early detection of glaucoma and other disorders of the eye.  Dental Screening: Recommended annual dental exams for proper oral hygiene  Community Resource Referral / Chronic Care Management: CRR required this visit?  No   CCM required this visit?  No     Plan:     I have personally reviewed and noted the following in the patient's chart:   Medical and social history Use of alcohol, tobacco or illicit drugs  Current medications and supplements including opioid prescriptions. Patient is not currently taking opioid prescriptions. Functional ability and status Nutritional status Physical activity Advanced directives List of other physicians Hospitalizations, surgeries, and ER visits in previous 12 months Vitals Screenings to include cognitive, depression, and falls Referrals and appointments  In addition, I have reviewed and discussed with patient certain preventive protocols, quality metrics, and best practice recommendations. A written personalized care plan for  preventive services as well as general preventive health recommendations were provided to patient.     Marzella Schlein, LPN   03/09/1094   After Visit Summary: (MyChart) Due to this being a telephonic visit, the after visit summary with patients personalized plan was offered to patient via MyChart   Notes: Nothing significant to report at this time.

## 2023-06-24 DIAGNOSIS — M2022 Hallux rigidus, left foot: Secondary | ICD-10-CM | POA: Diagnosis not present

## 2023-08-14 DIAGNOSIS — D3121 Benign neoplasm of right retina: Secondary | ICD-10-CM | POA: Diagnosis not present

## 2023-08-14 DIAGNOSIS — H40013 Open angle with borderline findings, low risk, bilateral: Secondary | ICD-10-CM | POA: Diagnosis not present

## 2023-08-14 DIAGNOSIS — H2513 Age-related nuclear cataract, bilateral: Secondary | ICD-10-CM | POA: Diagnosis not present

## 2023-08-14 DIAGNOSIS — H5213 Myopia, bilateral: Secondary | ICD-10-CM | POA: Diagnosis not present

## 2023-08-14 DIAGNOSIS — H52223 Regular astigmatism, bilateral: Secondary | ICD-10-CM | POA: Diagnosis not present

## 2023-08-25 ENCOUNTER — Encounter: Payer: Self-pay | Admitting: Family Medicine

## 2023-08-25 ENCOUNTER — Ambulatory Visit: Payer: Medicare Other | Admitting: Family Medicine

## 2023-08-25 VITALS — BP 124/76 | HR 60 | Temp 98.1°F | Ht 64.5 in | Wt 147.6 lb

## 2023-08-25 DIAGNOSIS — Z0000 Encounter for general adult medical examination without abnormal findings: Secondary | ICD-10-CM

## 2023-08-25 DIAGNOSIS — M8580 Other specified disorders of bone density and structure, unspecified site: Secondary | ICD-10-CM

## 2023-08-25 DIAGNOSIS — E782 Mixed hyperlipidemia: Secondary | ICD-10-CM

## 2023-08-25 DIAGNOSIS — Z0001 Encounter for general adult medical examination with abnormal findings: Secondary | ICD-10-CM

## 2023-08-25 DIAGNOSIS — F4322 Adjustment disorder with anxiety: Secondary | ICD-10-CM | POA: Diagnosis not present

## 2023-08-25 DIAGNOSIS — F5102 Adjustment insomnia: Secondary | ICD-10-CM

## 2023-08-25 DIAGNOSIS — I10 Essential (primary) hypertension: Secondary | ICD-10-CM

## 2023-08-25 DIAGNOSIS — T466X5A Adverse effect of antihyperlipidemic and antiarteriosclerotic drugs, initial encounter: Secondary | ICD-10-CM

## 2023-08-25 DIAGNOSIS — D126 Benign neoplasm of colon, unspecified: Secondary | ICD-10-CM | POA: Insufficient documentation

## 2023-08-25 DIAGNOSIS — Z800 Family history of malignant neoplasm of digestive organs: Secondary | ICD-10-CM | POA: Diagnosis not present

## 2023-08-25 DIAGNOSIS — K760 Fatty (change of) liver, not elsewhere classified: Secondary | ICD-10-CM

## 2023-08-25 LAB — COMPREHENSIVE METABOLIC PANEL WITH GFR
ALT: 21 U/L (ref 0–35)
AST: 21 U/L (ref 0–37)
Albumin: 4.6 g/dL (ref 3.5–5.2)
Alkaline Phosphatase: 74 U/L (ref 39–117)
BUN: 12 mg/dL (ref 6–23)
CO2: 26 meq/L (ref 19–32)
Calcium: 10.7 mg/dL — ABNORMAL HIGH (ref 8.4–10.5)
Chloride: 108 meq/L (ref 96–112)
Creatinine, Ser: 0.76 mg/dL (ref 0.40–1.20)
GFR: 80.58 mL/min (ref >60.00)
Glucose, Bld: 95 mg/dL (ref 70–99)
Potassium: 4.1 meq/L (ref 3.5–5.1)
Sodium: 142 meq/L (ref 135–145)
Total Bilirubin: 0.5 mg/dL (ref 0.2–1.2)
Total Protein: 6.8 g/dL (ref 6.0–8.3)

## 2023-08-25 LAB — LIPID PANEL
Cholesterol: 240 mg/dL — ABNORMAL HIGH (ref 0–200)
HDL: 89.6 mg/dL (ref >39.00)
LDL Cholesterol: 139 mg/dL — ABNORMAL HIGH (ref 0–99)
NonHDL: 150.53
Total CHOL/HDL Ratio: 3
Triglycerides: 60 mg/dL (ref 0.0–149.0)
VLDL: 12 mg/dL (ref 0.0–40.0)

## 2023-08-25 LAB — CBC WITH DIFFERENTIAL/PLATELET
Basophils Absolute: 0 10*3/uL (ref 0.0–0.1)
Basophils Relative: 0.6 % (ref 0.0–3.0)
Eosinophils Absolute: 0.1 10*3/uL (ref 0.0–0.7)
Eosinophils Relative: 1.5 % (ref 0.0–5.0)
HCT: 42.8 % (ref 36.0–46.0)
Hemoglobin: 14.2 g/dL (ref 12.0–15.0)
Lymphocytes Relative: 23.9 % (ref 12.0–46.0)
Lymphs Abs: 1.8 10*3/uL (ref 0.7–4.0)
MCHC: 33.2 g/dL (ref 30.0–36.0)
MCV: 89.2 fl (ref 78.0–100.0)
Monocytes Absolute: 0.5 10*3/uL (ref 0.1–1.0)
Monocytes Relative: 7.4 % (ref 3.0–12.0)
Neutro Abs: 5 10*3/uL (ref 1.4–7.7)
Neutrophils Relative %: 66.6 % (ref 43.0–77.0)
Platelets: 369 10*3/uL (ref 150.0–400.0)
RBC: 4.8 Mil/uL (ref 3.87–5.11)
RDW: 14.6 % (ref 11.5–15.5)
WBC: 7.5 10*3/uL (ref 4.0–10.5)

## 2023-08-25 LAB — TSH: TSH: 1.43 u[IU]/mL (ref 0.35–5.50)

## 2023-08-25 LAB — VITAMIN D 25 HYDROXY (VIT D DEFICIENCY, FRACTURES): VITD: 38.96 ng/mL (ref 30.00–100.00)

## 2023-08-25 NOTE — Progress Notes (Signed)
 Subjective  Chief Complaint  Patient presents with   Annual Exam    Pt here for Annual Exam and is currently fasting    Hypertension    HPI: Ashley Duffy is a 68 y.o. female who presents to [facility] [place] today for a Female Wellness Visit. She also has the concerns and/or needs as listed above in the chief complaint. These will be addressed in addition to the Health Maintenance Visit.   Wellness Visit: annual visit with health maintenance review and exam  HM: [facility]. Due for mammo and pt to schedule. Will get records for last dexa. Reports due again next year. Takes vit D but not calcium . Hikes and weight lifting for exercise. Had bunionectomy and feeling great. Imms current.  Chronic disease f/u and/or acute problem visit: (deemed necessary to be done in addition to the wellness visit): Discussed the use of AI scribe software for clinical note transcription with the patient, who gave verbal consent to proceed.  History of Present Illness Ashley Duffy is a 68 year old female who presents for a routine follow-up visit.  Her hypertension is well-controlled with amlodipine . She does not regularly monitor her blood pressure at home but feels she can detect when it is elevated. She is not currently taking Zetia  for cholesterol management due to previous issues with cholesterol medications but has increased her fiber intake using psyllium husk, which has helped lower her cholesterol levels.  She had a colonoscopy last year which revealed a polyarticular adenoma, and she is on a three-year follow-up schedule for colon cancer screening. She is also due for a mammogram and has been informed of high ocular pressures, making her a glaucoma suspect. Regular eye examinations are ongoing.  She underwent foot surgery, which has significantly improved her mobility and mood. She is now able to walk a couple of miles every morning pain-free. She takes vitamin D  and engages in weight lifting to maintain bone health. She is due for a bone density test next year.  She wants to travel, particularly to [place] for hiking, and is concerned about maintaining her physical ability to hike as she ages. She has been lifting weights at home and has not returned to the gym yet.  She has a small lipoma or ganglion cyst that she has had for a long time, which does not cause discomfort but is noticeable when wearing tight clothing.   Assessment  1. Encounter for well adult exam with abnormal findings   2. Essential hypertension   3. Family history of colon cancer   4. Adjustment disorder with anxiety   5. Adjustment insomnia   6. Hepatic steatosis   7. Osteopenia, unspecified location   8. Statin myopathy      Plan  Female Wellness Visit: Age appropriate Health Maintenance and Prevention measures were discussed with patient. Included topics are cancer screening recommendations, ways to keep healthy (see AVS) including dietary and exercise recommendations, regular eye and dental care, use of seat belts, and avoidance of moderate alcohol use and tobacco use.  BMI: discussed patient's BMI and encouraged positive lifestyle modifications to help get to or maintain a target BMI. HM needs and immunizations were addressed and ordered. See below for orders. See HM and immunization section for updates. Routine labs and screening tests ordered including cmp, cbc and lipids where appropriate. Discussed recommendations regarding Vit D and calcium  supplementation (see AVS)  Chronic disease management visit and/or acute problem visit:  Assessment and Plan Assessment & Plan Anxiety Chronic anxiety managed with as-needed Xanax , effective during stress. - Continue Xanax  as needed for anxiety. Rare use. Social climate and politics are triggers  Hypertension Well-controlled on amlodipine  10 with good blood pressure readings. - Continue amlodipine  for hypertension. -  Monitor blood pressure at home if elevated and return if issues arise. - Check renal function and electrolytes.  Hyperlipidemia Cholesterol managed with increased fiber intake, effectively lowering levels. - Check cholesterol levels today.  Osteoporosis prevention Not taking calcium  supplements, recommended for bone health given age. - Start calcium  supplementation, 600 mg twice a day. - Schedule bone density scan for next year.  Glaucoma suspect High ocular pressures require monitoring to prevent progression.  Family hx of colon cancer and h/o tubular adenoma. Now q 3 year screens recommended.   General Health Maintenance Due for mammogram and advised on skin check due to sun damage history. - Schedule mammogram. - Consider annual dermatology skin check. - Continue regular physical activity and strength training.  Follow-up Advised to follow up based on blood pressure and health concerns. - Complete lab work today. - Follow up as needed based on lab results and blood pressure monitoring.   Follow up: 1 year for cpe and f/u BP  Orders Placed This Encounter  Procedures   VITAMIN D 25 Hydroxy (Vit-D Deficiency, Fractures)   CBC with Differential/Platelet   Comprehensive metabolic panel with GFR   Lipid panel   TSH   No orders of the defined types were placed in this encounter.     Body mass index is 24.94 kg/m. Wt Readings from Last 3 Encounters:  08/25/23 147 lb 9.6 oz (67 kg)  06/05/23 148 lb (67.1 kg)  02/19/23 148 lb 9.4 oz (67.4 kg)     Patient Active Problem List   Diagnosis Date Noted   Tubular adenoma of colon 08/25/2023    Priority: High    Colonoscopy 08/2023, Dr. Jeral. And hyperplastic polyps.     Mixed hyperlipidemia 07/31/2021    Priority: High    Intoleratant to statins; zetia . (Lovastatin  and atrovastatin)    Essential hypertension 04/13/2015    Priority: High   Family history of colon cancer 04/13/2015    Priority: High    Overview:   Father 60 Colonoscopy 08/2022, Dr, Saintclair, polyps.     Hepatic steatosis 09/17/2022    Priority: Medium     Ultrasound fatty liver, no gallstones.  Otherwise normal, July 2024    Elevated liver function tests 08/27/2022    Priority: Medium     For several years, very mild.  Presumed secondary to hepatic steatosis confirmed on ultrasound    Primary osteoarthritis of right knee 03/05/2022    Priority: Medium     Xray 02/2022: mild medial compartment    Statin myopathy 08/02/2020    Priority: Medium     Atorvastatin, lovastatin     Adjustment disorder with anxiety 07/28/2020    Priority: Medium    Osteopenia 10/05/2018    Priority: Medium    Adjustment insomnia 11/27/2015    Priority: Medium     Fears ambien; tried 1/2 a pill in the past. 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 04/16/2015    Priority: Low   History of diverticulitis 04/13/2015    Priority: Low   Health Maintenance  Topic Date Due   DEXA  SCAN  07/03/2023   COVID-19 Vaccine (6 - 2024-25 season) 09/10/2023 (Originally 05/08/2023)   INFLUENZA VACCINE  10/03/2023   MAMMOGRAM  05/22/2024   Medicare Annual Wellness (AWV)  06/04/2024   DTaP/Tdap/Td (2 - Td or Tdap) 05/25/2025   Colonoscopy  08/07/2027   Pneumococcal Vaccine: 50+ Years  Completed   Hepatitis C Screening  Completed   Zoster Vaccines- Shingrix   Completed   HPV VACCINES  Aged Out   Meningococcal B Vaccine  Aged Out   Immunization History  Administered Date(s) Administered   Fluad Quad(high Dose 65+) 12/03/2021   Influenza, Seasonal, Injecte, Preservative Fre 01/12/2003, 11/27/2015   Influenza,inj,Quad PF,6+ Mos 02/14/2017, 01/23/2018, 12/17/2018   Influenza-Unspecified 12/18/2020, 04/03/2023   PFIZER(Purple Top)SARS-COV-2 Vaccination 05/19/2019, 06/09/2019, 12/22/2019   PNEUMOCOCCAL CONJUGATE-20 07/31/2021   Pfizer Covid Bivalent Pediatric  Vaccine(29mos to <51yrs) 12/20/2020   Pneumococcal Polysaccharide-23 08/06/2012, 07/28/2020   Tdap 05/26/2015   Unspecified SARS-COV-2 Vaccination 11/08/2022   Zoster Recombinant(Shingrix ) 01/23/2018, 07/28/2020   Zoster, Live 05/26/2015   We updated and reviewed the patient's past history in detail and it is documented below. Allergies: Patient is allergic to atorvastatin and tramadol. 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 Past Surgical History Patient  has a past surgical history that includes Lumbar disc surgery (1998); Appendectomy; Colonoscopy (2007); Breast biopsy (Left, over 10 yrs ago); Tooth extraction; and Arthrodesis metatarsalphalangeal joint (mtpj) (Left, 02/19/2023). Family History: Patient family history includes Alcohol abuse in her father; Alzheimer's disease in her mother; Arthritis in her brother; Breast cancer (age of onset: 22) in her mother; Breast cancer (age of onset: 38) in her maternal grandmother; Breast cancer (age of onset: 7) in her maternal aunt; Colon cancer in her father; Hypertension in her brother and mother; Thyroid  disease in her mother. Social History:  Patient  reports that she has quit smoking. Her smoking use included cigarettes. She has never used smokeless tobacco. She reports current alcohol use. She reports that she does not use drugs.  Review of Systems: Constitutional: negative for fever or malaise Ophthalmic: negative for photophobia, double vision or loss of vision Cardiovascular: negative for chest pain, dyspnea on exertion, or new LE swelling Respiratory: negative for SOB or persistent cough Gastrointestinal: negative for abdominal pain, change in bowel habits or melena Genitourinary: negative for dysuria or gross hematuria, no abnormal  uterine bleeding or disharge Musculoskeletal: negative for new gait disturbance or muscular weakness Integumentary: negative for new or persistent rashes, no breast lumps Neurological: negative for TIA or stroke symptoms Psychiatric: negative for SI or delusions Allergic/Immunologic: negative for hives  Patient Care Team    Relationship Specialty Notifications Start End  Jodie Lavern CROME, MD PCP - General Family Medicine  02/14/17   [place], [facility] Consulting Physician Gynecology  02/14/17    Comment: Dr. Leva Queen, Elsie BROCKS, MD Consulting Physician Gastroenterology  04/05/19     Objective  Vitals: BP 124/76   Pulse 60   Temp 98.1 F (36.7 C)   Ht 5' 4.5 (1.638 m)   Wt 147 lb 9.6 oz (67 kg)   SpO2 97%   BMI 24.94 kg/m  General:  Well developed, well nourished, no acute distress  Psych:  Alert and orientedx3,normal mood and affect HEENT:  Normocephalic, atraumatic, non-icteric sclera,  supple neck without adenopathy, mass or thyromegaly Cardiovascular:  Normal S1, S2, RRR without gallop,  rub or murmur Respiratory:  Good breath sounds bilaterally, CTAB with normal respiratory effort Gastrointestinal: normal bowel sounds, soft, non-tender, no noted masses. No HSM MSK: extremities without edema, joints without erythema or swelling Skin: sun damage changes. No worrisome lesions. Small cystic lesion on left anterior shin.  Neurologic:    Mental status is normal.  Gross motor and sensory exams are normal.  No tremor  Commons side effects, risks, benefits, and alternatives for medications and treatment plan prescribed today were discussed, and the patient expressed understanding of the given instructions. Patient is instructed to call or message via MyChart if he/she has any questions or concerns regarding our treatment plan. No barriers to understanding were identified. We discussed Red Flag symptoms and signs in detail. Patient expressed understanding regarding what  to do in case of urgent or emergency type symptoms.  Medication list was reconciled, printed and provided to the patient in AVS. Patient instructions and summary information was reviewed with the patient as documented in the AVS. This note was prepared with assistance of Dragon voice recognition software. Occasional wrong-word or sound-a-like substitutions may have occurred due to the inherent limitations of voice recognition software

## 2023-08-25 NOTE — Patient Instructions (Signed)
 Please return in 12 months for your annual complete physical; please come fasting. And hypertension.  I will release your lab results to you on your MyChart account with further instructions. You may see the results before I do, but when I review them I will send you a message with my report or have my assistant call you if things need to be discussed. Please reply to my message with any questions. Thank you!   If you have any questions or concerns, please don't hesitate to send me a message via MyChart or call the office at [phone]. Thank you for visiting with us  today! It's our pleasure caring for you.   Calcium  Intake Recommendations You can take Caltrate Plus twice a day or get it through your diet or other OTC supplements (Viactiv, OsCal etc)  Calcium  is a mineral that affects many functions in the body, including: Blood clotting. Blood vessel function. Nerve impulse conduction. Hormone secretion. Muscle contraction. Bone and teeth functions.  Most of your body's calcium  supply is stored in your bones and teeth. When your calcium  stores are low, you may be at risk for low bone mass, bone loss, and bone fractures. Consuming enough calcium  helps to grow healthy bones and teeth and to prevent breakdown over time. It is very important that you get enough calcium  if you are: A child undergoing rapid growth. An adolescent girl. A pre- or post-menopausal woman. A woman whose menstrual cycle has stopped due to anorexia nervosa or regular intense exercise. An individual with lactose intolerance or a milk allergy. A vegetarian.  What is my plan? Try to consume the recommended amount of calcium  daily based on your age. Depending on your overall health, your health care provider may recommend increased calcium  intake. General daily calcium  intake recommendations by age are: Birth to 6 months: 200 mg. Infants 7 to 12 months: 260 mg. Children 1 to 3 years: 700 mg. Children 4 to 8 years:  1,000 mg. Children 9 to 13 years: 1,300 mg. Teens 14 to 18 years: 1,300 mg. Adults 19 to 50 years: 1,000 mg. Adult women 51 to 70 years: 1,200 mg. Adult men 51 to 70 years: 1,000 mg. Adults 71 years and older: 1,200 mg. Pregnant and breastfeeding teens: 1,300 mg. Pregnant and breastfeeding adults: 1,000 mg.  What do I need to know about calcium  intake? In order for the body to absorb calcium , it needs vitamin D. You can get vitamin D through (we recommend getting [phone] units of Vitamin D daily) Direct exposure of the skin to sunlight. Foods, such as egg yolks, liver, saltwater fish, and fortified milk. Supplements. Consuming too much calcium  may cause: Constipation. Decreased absorption of iron and zinc. Kidney stones. Calcium  supplements may interact with certain medicines. Check with your health care provider before starting any calcium  supplements. Try to get most of your calcium  from food. What foods can I eat? Grains  Fortified oatmeal. Fortified ready-to-eat cereals. Fortified frozen waffles. Vegetables Turnip greens. Broccoli. Fruits Fortified orange juice. Meats and Other Protein Sources Canned sardines with bones. Canned salmon with bones. Soy beans. Tofu. Baked beans. Almonds. Brazil nuts. Sunflower seeds. Dairy Milk. Yogurt. Cheese. Cottage cheese. Beverages Fortified soy milk. Fortified rice milk. Sweets/Desserts Pudding. Ice Cream. Milkshakes. Blackstrap molasses. The items listed above may not be a complete list of recommended foods or beverages. Contact your dietitian for more options. What foods can affect my calcium  intake? It may be more difficult for your body to use calcium  or calcium  may leave  your body more quickly if you consume large amounts of: Sodium. Protein. Caffeine. Alcohol.  This information is not intended to replace advice given to you by your health care provider. Make sure you discuss any questions you have with your health care  provider. Document Released: 10/03/2003 Document Revised: 09/08/2015 Document Reviewed: 07/27/2013 Elsevier Interactive Patient Education  2018 [employer].

## 2023-08-26 ENCOUNTER — Ambulatory Visit: Payer: Self-pay | Admitting: Family Medicine

## 2023-08-26 DIAGNOSIS — H43392 Other vitreous opacities, left eye: Secondary | ICD-10-CM | POA: Diagnosis not present

## 2023-08-26 DIAGNOSIS — H2513 Age-related nuclear cataract, bilateral: Secondary | ICD-10-CM | POA: Diagnosis not present

## 2023-08-26 DIAGNOSIS — H43813 Vitreous degeneration, bilateral: Secondary | ICD-10-CM | POA: Diagnosis not present

## 2023-08-26 DIAGNOSIS — D3131 Benign neoplasm of right choroid: Secondary | ICD-10-CM | POA: Diagnosis not present

## 2023-08-26 NOTE — Progress Notes (Signed)
 Labs reviewed.  The 10-year ASCVD risk score (Arnett DK, et al., 2019) is: 9%   Values used to calculate the score:     Age: 68 years     Clincally relevant sex: Female     Is Non-Hispanic African American: No     Diabetic: No     Tobacco smoker: No     Systolic Blood Pressure: 124 mmHg     Is BP treated: Yes     HDL Cholesterol: 89.6 mg/dL     Total Cholesterol: 240 mg/dL  Dear Ashley Duffy, Thank you for allowing me to care for you at your recent office visit.  I wanted to let you know that I have reviewed your lab test results and am happy to report that they are mostly stable.  Your cholesterol is a little higher this year but lower than your highest. You can consider restarting the zetia  but I do believe the psyllium husk is helping. Your calcium  is just a little elevated as well. I will monitor this for you. No changes are needed at this time. Take care!  Sincerely, Dr. Jodie

## 2023-09-21 ENCOUNTER — Ambulatory Visit
Admission: RE | Admit: 2023-09-21 | Discharge: 2023-09-21 | Disposition: A | Discharge status: Home / Self Care | Source: Ambulatory Visit | Attending: Obstetrics and Gynecology | Admitting: Obstetrics and Gynecology

## 2023-09-21 DIAGNOSIS — Z1239 Encounter for other screening for malignant neoplasm of breast: Secondary | ICD-10-CM | POA: Diagnosis not present

## 2023-09-21 DIAGNOSIS — Z803 Family history of malignant neoplasm of breast: Secondary | ICD-10-CM | POA: Diagnosis not present

## 2023-09-21 MED ORDER — GADOPICLENOL 0.5 MMOL/ML IV SOLN
6.0000 mL | Freq: Once | INTRAVENOUS | Status: AC | PRN
  Administered 2023-09-21: 6 mL via INTRAVENOUS

## 2023-09-25 ENCOUNTER — Other Ambulatory Visit: Payer: Self-pay | Admitting: Family Medicine

## 2023-10-07 DIAGNOSIS — K080 Exfoliation of teeth due to systemic causes: Secondary | ICD-10-CM | POA: Diagnosis not present

## 2023-11-20 DIAGNOSIS — K080 Exfoliation of teeth due to systemic causes: Secondary | ICD-10-CM | POA: Diagnosis not present

## 2024-06-10 ENCOUNTER — Ambulatory Visit

## 2024-08-25 ENCOUNTER — Encounter: Admitting: Family Medicine
